# Patient Record
Sex: Female | Born: 1985 | Race: White | Hispanic: No | Marital: Single | Smoking: Never smoker
Health system: Southern US, Community
[De-identification: ages and names within clinical notes are randomized; demographics above are authoritative.]

## PROBLEM LIST (undated history)

## (undated) ENCOUNTER — Inpatient Hospital Stay (HOSPITAL_COMMUNITY): Payer: Self-pay

## (undated) DIAGNOSIS — Z8669 Personal history of other diseases of the nervous system and sense organs: Secondary | ICD-10-CM

## (undated) DIAGNOSIS — Z9109 Other allergy status, other than to drugs and biological substances: Secondary | ICD-10-CM

## (undated) DIAGNOSIS — J45909 Unspecified asthma, uncomplicated: Secondary | ICD-10-CM

## (undated) DIAGNOSIS — R87619 Unspecified abnormal cytological findings in specimens from cervix uteri: Secondary | ICD-10-CM

## (undated) DIAGNOSIS — I499 Cardiac arrhythmia, unspecified: Secondary | ICD-10-CM

## (undated) HISTORY — DX: Personal history of other diseases of the nervous system and sense organs: Z86.69

## (undated) HISTORY — DX: Unspecified abnormal cytological findings in specimens from cervix uteri: R87.619

---
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
## 2014-05-08 ENCOUNTER — Inpatient Hospital Stay (HOSPITAL_COMMUNITY)
Admission: AD | Admit: 2014-05-08 | Discharge: 2014-05-08 | Disposition: A | Discharge status: Home / Self Care | Payer: Medicaid Other | Source: Ambulatory Visit | Attending: Obstetrics & Gynecology | Admitting: Obstetrics & Gynecology

## 2014-05-08 ENCOUNTER — Encounter (HOSPITAL_COMMUNITY): Payer: Self-pay | Admitting: General Practice

## 2014-05-08 DIAGNOSIS — O9989 Other specified diseases and conditions complicating pregnancy, childbirth and the puerperium: Principal | ICD-10-CM

## 2014-05-08 DIAGNOSIS — O99891 Other specified diseases and conditions complicating pregnancy: Secondary | ICD-10-CM | POA: Insufficient documentation

## 2014-05-08 DIAGNOSIS — R1013 Epigastric pain: Secondary | ICD-10-CM | POA: Diagnosis present

## 2014-05-08 DIAGNOSIS — K529 Noninfective gastroenteritis and colitis, unspecified: Secondary | ICD-10-CM

## 2014-05-08 DIAGNOSIS — K2960 Other gastritis without bleeding: Secondary | ICD-10-CM

## 2014-05-08 DIAGNOSIS — R350 Frequency of micturition: Secondary | ICD-10-CM | POA: Insufficient documentation

## 2014-05-08 DIAGNOSIS — O210 Mild hyperemesis gravidarum: Secondary | ICD-10-CM | POA: Diagnosis not present

## 2014-05-08 DIAGNOSIS — O219 Vomiting of pregnancy, unspecified: Secondary | ICD-10-CM

## 2014-05-08 HISTORY — DX: Cardiac arrhythmia, unspecified: I49.9

## 2014-05-08 LAB — URINALYSIS, ROUTINE W REFLEX MICROSCOPIC
BILIRUBIN URINE: NEGATIVE
Glucose, UA: NEGATIVE mg/dL
HGB URINE DIPSTICK: NEGATIVE
KETONES UR: 40 mg/dL — AB
Leukocytes, UA: NEGATIVE
NITRITE: NEGATIVE
PROTEIN: NEGATIVE mg/dL
Specific Gravity, Urine: 1.02 (ref 1.005–1.030)
UROBILINOGEN UA: 0.2 mg/dL (ref 0.0–1.0)
pH: 6 (ref 5.0–8.0)

## 2014-05-08 LAB — CBC
HEMATOCRIT: 36.3 % (ref 36.0–46.0)
Hemoglobin: 12.6 g/dL (ref 12.0–15.0)
MCH: 30.9 pg (ref 26.0–34.0)
MCHC: 34.7 g/dL (ref 30.0–36.0)
MCV: 89 fL (ref 78.0–100.0)
PLATELETS: 192 10*3/uL (ref 150–400)
RBC: 4.08 MIL/uL (ref 3.87–5.11)
RDW: 13.1 % (ref 11.5–15.5)
WBC: 9.5 10*3/uL (ref 4.0–10.5)

## 2014-05-08 LAB — POCT PREGNANCY, URINE: Preg Test, Ur: POSITIVE — AB

## 2014-05-08 MED ORDER — PROMETHAZINE HCL 25 MG PO TABS: 25.0000 mg | ORAL_TABLET | Freq: Four times a day (QID) | ORAL | Status: DC | PRN

## 2014-05-08 MED ORDER — RANITIDINE HCL 150 MG PO TABS: 150.0000 mg | ORAL_TABLET | Freq: Two times a day (BID) | ORAL | Status: DC

## 2014-05-08 MED ORDER — RANITIDINE HCL 150 MG PO TABS
150.0000 mg | ORAL_TABLET | Freq: Two times a day (BID) | ORAL | Status: DC
Start: 2014-05-08 — End: 2014-06-13

## 2014-05-08 MED ORDER — FAMOTIDINE IN NACL 20-0.9 MG/50ML-% IV SOLN
20.0000 mg | Freq: Once | INTRAVENOUS | Status: AC
  Administered 2014-05-08: 20 mg via INTRAVENOUS
  Filled 2014-05-08: qty 50

## 2014-05-08 MED ORDER — PROMETHAZINE HCL 25 MG/ML IJ SOLN
25.0000 mg | Freq: Once | INTRAVENOUS | Status: AC
  Administered 2014-05-08: 25 mg via INTRAVENOUS
  Filled 2014-05-08: qty 1

## 2014-05-08 NOTE — MAU Provider Note (Signed)
History     CSN: [id number]  Arrival date and time: 05/08/14 1305   None     Chief Complaint  Patient presents with   Abdominal Pain   Diarrhea   Fever   Emesis   HPI 28 y.o. Catherine Moreno @[redacted]w[redacted]d  presents complaining of epigastric abdominal pain that began 5-6 days ago.  2 days ago she noticed a fever up to 101 degrees this morning.  She has taken Tylenol today.  The pain is in the top of her abdomen 9/10 pain scale at it worst (presently 5/10), not associated with eating.  She has not noticed aggravating factors.  Occasionally she has lower abdominal pain also but states it "feels very GI related."  She complains of extreme nausea but no vomiting.  She has reduced appetite due to the nausea.  She notes previous episodes of indigestion and states this is very different.  She has increased urinary frequency but not dysuria.  She has no LOF, bleeding.    OB History   Grav Para Term Preterm Abortions TAB SAB Ect Mult Living   1               Past Medical History  Diagnosis Date   Dysrhythmia     svt    Past Surgical History  Procedure Laterality Date   Wisdom tooth extraction Bilateral 2007    History reviewed. No pertinent family history.  History  Substance Use Topics   Smoking status: Never Smoker    Smokeless tobacco: Never Used   Alcohol Use: No    Allergies:  Allergies  Allergen Reactions   Latex     No prescriptions prior to admission    Review of Systems  Constitutional: Positive for fever, chills and malaise/fatigue. Negative for diaphoresis.  HENT: Negative for congestion and sore throat.   Eyes: Negative for blurred vision.  Respiratory: Negative for cough, shortness of breath and wheezing.   Cardiovascular: Negative for chest pain and palpitations.  Gastrointestinal: Positive for nausea, abdominal pain and diarrhea. Negative for heartburn, vomiting and constipation.  Genitourinary: Positive for frequency. Negative for dysuria, urgency and  hematuria.  Skin: Negative for itching and rash.  Neurological: Positive for dizziness and weakness. Negative for headaches.   Physical Exam   Blood pressure 114/64, pulse 77, temperature 98.9 F (37.2 C), resp. rate 20, height 5\' 8"  (1.727 m), weight 58.514 kg (129 lb), last menstrual period 03/24/2014.  Physical Exam  Constitutional: She is oriented to person, place, and time. She appears well-developed and well-nourished. No distress.  HENT:  Head: Normocephalic and atraumatic.  Eyes: EOM are normal.  Neck: Normal range of motion.  Cardiovascular: Normal rate, regular rhythm and normal heart sounds.  Exam reveals no gallop and no friction rub.   No murmur heard. Respiratory: Effort normal and breath sounds normal.  GI: Soft. Bowel sounds are normal. There is tenderness (Palpation of RUQ results in pain in LUQ.  Palpation of LUQ results in pain in LUQ.  There is no tenderness in either lower quadrant for the duration of MAU stay.).  Musculoskeletal: Normal range of motion.  Neurological: She is alert and oriented to person, place, and time.  Skin: Skin is warm. She is diaphoretic.  Psychiatric: She has a normal mood and affect.    MAU Course  Procedures IV - 1 liter LR given with 25mg  phenergan and 20mg  pepcid  MDM Neg u/a, CBC stable, no lower abd pain, no bleeding  Assessment and Plan  Assessment:  1. Nausea and vomiting during pregnancy prior to [redacted] weeks gestation   2. Gastroenteritis, infectious, presumed   3. Reflux gastritis      Plan: Discharge to home Phenergan for nausea Zantac 150mg  bid for heartburn Pt to see Ob to establish [facility] asap. Pt to return to MAU for emergencies: to include lower pelvic pain, vaginal bleeding.    Duane Boston Clark 05/08/2014, 2:45 PM

## 2014-05-08 NOTE — Discharge Instructions (Signed)
Morning Sickness Morning sickness is when you feel sick to your stomach (nauseous) during pregnancy. This nauseous feeling may or may not come with vomiting. It often occurs in the morning but can be a problem any time of day. Morning sickness is most common during the first trimester, but it may continue throughout pregnancy. While morning sickness is unpleasant, it is usually harmless unless you develop severe and continual vomiting (hyperemesis gravidarum). This condition requires more intense treatment.  CAUSES  The cause of morning sickness is not completely known but seems to be related to normal hormonal changes that occur in pregnancy. RISK FACTORS You are at greater risk if you:  Experienced nausea or vomiting before your pregnancy.  Had morning sickness during a previous pregnancy.  Are pregnant with more than one baby, such as twins. TREATMENT  Do not use any medicines (prescription, over-the-counter, or herbal) for morning sickness without first talking to your health care provider. Your health care provider may prescribe or recommend:  Vitamin B6 supplements.  Anti-nausea medicines.  The herbal medicine ginger. HOME CARE INSTRUCTIONS   Only take over-the-counter or prescription medicines as directed by your health care provider.  Taking multivitamins before getting pregnant can prevent or decrease the severity of morning sickness in most women.  Eat a piece of dry toast or unsalted crackers before getting out of bed in the morning.  Eat five or six small meals a day.  Eat dry and bland foods (rice, baked potato). Foods high in carbohydrates are often helpful.  Do not drink liquids with your meals. Drink liquids between meals.  Avoid greasy, fatty, and spicy foods.  Get someone to cook for you if the smell of any food causes nausea and vomiting.  If you feel nauseous after taking prenatal vitamins, take the vitamins at night or with a snack.  Snack on protein  foods (nuts, yogurt, cheese) between meals if you are hungry.  Eat unsweetened gelatins for desserts.  Wearing an acupressure wristband (worn for sea sickness) may be helpful.  Acupuncture may be helpful.  Do not smoke.  Get a humidifier to keep the air in your house free of odors.  Get plenty of fresh air. SEEK MEDICAL CARE IF:   Your home remedies are not working, and you need medicine.  You feel dizzy or lightheaded.  You are losing weight. SEEK IMMEDIATE MEDICAL CARE IF:   You have persistent and uncontrolled nausea and vomiting.  You pass out (faint). MAKE SURE YOU:  Understand these instructions.  Will watch your condition.  Will get help right away if you are not doing well or get worse. Document Released: 11/04/2006 Document Revised: 09/18/2013 Document Reviewed: 02/28/2013 [facility] Patient Information 2015 [place], [place]. This information is not intended to replace advice given to you by your health care provider. Make sure you discuss any questions you have with your health care provider. Nausea and Vomiting Nausea is a sick feeling that often comes before throwing up (vomiting). Vomiting is a reflex where stomach contents come out of your mouth. Vomiting can cause severe loss of body fluids (dehydration). Children and elderly adults can become dehydrated quickly, especially if they also have diarrhea. Nausea and vomiting are symptoms of a condition or disease. It is important to find the cause of your symptoms. CAUSES   Direct irritation of the stomach lining. This irritation can result from increased acid production (gastroesophageal reflux disease), infection, food poisoning, taking certain medicines (such as nonsteroidal anti-inflammatory drugs), alcohol use, or tobacco  use.  Signals from the brain.These signals could be caused by a headache, heat exposure, an inner ear disturbance, increased pressure in the brain from injury, infection, a tumor, or a  concussion, pain, emotional stimulus, or metabolic problems.  An obstruction in the gastrointestinal tract (bowel obstruction).  Illnesses such as diabetes, hepatitis, gallbladder problems, appendicitis, kidney problems, cancer, sepsis, atypical symptoms of a heart attack, or eating disorders.  Medical treatments such as chemotherapy and radiation.  Receiving medicine that makes you sleep (general anesthetic) during surgery. DIAGNOSIS Your caregiver may ask for tests to be done if the problems do not improve after a few days. Tests may also be done if symptoms are severe or if the reason for the nausea and vomiting is not clear. Tests may include:  Urine tests.  Blood tests.  Stool tests.  Cultures (to look for evidence of infection).  X-rays or other imaging studies. Test results can help your caregiver make decisions about treatment or the need for additional tests. TREATMENT You need to stay well hydrated. Drink frequently but in small amounts.You may wish to drink water, sports drinks, clear broth, or eat frozen ice pops or gelatin dessert to help stay hydrated.When you eat, eating slowly may help prevent nausea.There are also some antinausea medicines that may help prevent nausea. HOME CARE INSTRUCTIONS   Take all medicine as directed by your caregiver.  If you do not have an appetite, do not force yourself to eat. However, you must continue to drink fluids.  If you have an appetite, eat a normal diet unless your caregiver tells you differently.  Eat a variety of complex carbohydrates (rice, wheat, potatoes, bread), lean meats, yogurt, fruits, and vegetables.  Avoid high-fat foods because they are more difficult to digest.  Drink enough water and fluids to keep your urine clear or pale yellow.  If you are dehydrated, ask your caregiver for specific rehydration instructions. Signs of dehydration may include:  Severe thirst.  Dry lips and mouth.  Dizziness.  Dark  urine.  Decreasing urine frequency and amount.  Confusion.  Rapid breathing or pulse. SEEK IMMEDIATE MEDICAL CARE IF:   You have blood or brown flecks (like coffee grounds) in your vomit.  You have black or bloody stools.  You have a severe headache or stiff neck.  You are confused.  You have severe abdominal pain.  You have chest pain or trouble breathing.  You do not urinate at least once every 8 hours.  You develop cold or clammy skin.  You continue to vomit for longer than 24 to 48 hours.  You have a fever. MAKE SURE YOU:   Understand these instructions.  Will watch your condition.  Will get help right away if you are not doing well or get worse. Document Released: 09/13/2005 Document Revised: 12/06/2011 Document Reviewed: 02/10/2011 [facility] Patient Information 2015 [place], [place]. This information is not intended to replace advice given to you by your health care provider. Make sure you discuss any questions you have with your health care provider.

## 2014-05-08 NOTE — MAU Note (Signed)
Since Sat, has had a lot of abd pain, epigastric.  Diarrhea off and on, horrible nausea. Fever up to 101.7.

## 2014-05-28 ENCOUNTER — Encounter: Payer: Self-pay | Admitting: Obstetrics & Gynecology

## 2014-05-28 ENCOUNTER — Ambulatory Visit (INDEPENDENT_AMBULATORY_CARE_PROVIDER_SITE_OTHER): Payer: BC Managed Care – PPO | Admitting: Obstetrics & Gynecology

## 2014-05-28 VITALS — BP 125/71 | HR 93 | Wt 124.0 lb

## 2014-05-28 DIAGNOSIS — Z3491 Encounter for supervision of normal pregnancy, unspecified, first trimester: Secondary | ICD-10-CM

## 2014-05-28 DIAGNOSIS — Z3400 Encounter for supervision of normal first pregnancy, unspecified trimester: Secondary | ICD-10-CM | POA: Diagnosis not present

## 2014-05-28 DIAGNOSIS — Z3401 Encounter for supervision of normal first pregnancy, first trimester: Secondary | ICD-10-CM

## 2014-05-28 MED ORDER — DOXYLAMINE-PYRIDOXINE 10-10 MG PO TBEC: 2.0000 | DELAYED_RELEASE_TABLET | Freq: Every day | ORAL | Status: DC

## 2014-05-28 MED ORDER — ONDANSETRON HCL 8 MG PO TABS: 8.0000 mg | ORAL_TABLET | Freq: Three times a day (TID) | ORAL | Status: DC | PRN

## 2014-05-28 NOTE — Progress Notes (Signed)
Ketones-large. Last pap smear 5/15 WNL [place].  Bedside U/S showed IUP with FHT of 180BPM  CRL-23.30mm and GA [redacted]w[redacted]d

## 2014-05-28 NOTE — Progress Notes (Signed)
   Subjective:    Catherine Moreno is a G1P0 [redacted]w[redacted]d being seen today for her first obstetrical visit.  Her obstetrical history is significant for SVT. Patient does intend to breast feed. Pregnancy history fully reviewed.  Patient reports nausea and vomiting.  Filed Vitals:   05/28/14 1457  BP: 125/71  Pulse: 93  Weight: 124 lb (56.246 kg)    HISTORY: OB History  Gravida Para Term Preterm AB SAB TAB Ectopic Multiple Living  1             # Outcome Date GA Lbr Len/2nd Weight Sex Delivery Anes PTL Lv  1 CUR              Past Medical History  Diagnosis Date  . Dysrhythmia     svt  . Hx of migraines   . Abnormal Pap smear of cervix     repeat was neg   Past Surgical History  Procedure Laterality Date  . Wisdom tooth extraction Bilateral 2007   Family History  Problem Relation Age of Onset  . Hypothyroidism Mother   . Migraines Mother   . Lymphoma Maternal Grandmother   . Heart attack Maternal Grandmother   . COPD Maternal Grandmother   . Atrial fibrillation Maternal Grandfather   . COPD Maternal Grandfather   . Pulmonary Hypertension Maternal Grandfather   . Hypertension Father   . Lymphoma Paternal Grandfather     died age 43  . Hypertension Paternal Grandmother   . Hyperlipidemia Paternal Grandmother      Exam    Uterus:     Pelvic Exam:    Perineum: No Hemorrhoids   Vulva: normal   Vagina:  normal mucosa   pH: n/a   Cervix: no lesions   Adnexa: normal adnexa   Bony Pelvis: average  System: Breast:  normal appearance, no masses or tenderness   Skin: normal coloration and turgor, no rashes    Neurologic: oriented   Extremities: no deformities   HEENT sclera clear, anicteric   Mouth/Teeth mucous membranes moist, pharynx normal without lesions and dental hygiene good   Neck supple and no masses   Cardiovascular: regular rate and rhythm   Respiratory:  appears well, vitals normal, no respiratory distress, acyanotic, normal RR, chest clear, no wheezing,  crepitations, rhonchi, normal symmetric air entry   Abdomen: soft, non-tender; bowel sounds normal; no masses,  no organomegaly   Urinary: urethral meatus normal      Assessment:    Pregnancy: G1P0 There are no active problems to display for this patient.       Plan:     Initial labs drawn. Prenatal vitamins. Problem list reviewed and updated. Genetic Screening discussed:  Pt thinking about it and will call in 1 week to schedule  Ultrasound discussed; fetal survey: requested.  Follow up in 4 weeks.  Viable IUP by [place] 9 week 1 day Diclegis and phenergan for nausea and vomiting.  Pt to call if continues.  Aubery Douthat H. 05/28/2014

## 2014-06-01 LAB — OBSTETRIC PANEL
ANTIBODY SCREEN: NEGATIVE
BASOS ABS: 0 10*3/uL (ref 0.0–0.1)
BASOS PCT: 0 % (ref 0–1)
EOS ABS: 0.2 10*3/uL (ref 0.0–0.7)
Eosinophils Relative: 2 % (ref 0–5)
HCT: 34.3 % — ABNORMAL LOW (ref 36.0–46.0)
HEMOGLOBIN: 11.5 g/dL — AB (ref 12.0–15.0)
HEP B S AG: NEGATIVE
Lymphocytes Relative: 29 % (ref 12–46)
Lymphs Abs: 2.3 10*3/uL (ref 0.7–4.0)
MCH: 30 pg (ref 26.0–34.0)
MCHC: 33.5 g/dL (ref 30.0–36.0)
MCV: 89.6 fL (ref 78.0–100.0)
MONO ABS: 0.4 10*3/uL (ref 0.1–1.0)
MONOS PCT: 5 % (ref 3–12)
NEUTROS ABS: 5.1 10*3/uL (ref 1.7–7.7)
NEUTROS PCT: 64 % (ref 43–77)
Platelets: 193 10*3/uL (ref 150–400)
RBC: 3.83 MIL/uL — ABNORMAL LOW (ref 3.87–5.11)
RDW: 13.8 % (ref 11.5–15.5)
Rh Type: POSITIVE
Rubella: 1.04 Index — ABNORMAL HIGH (ref <0.90)
WBC: 7.9 10*3/uL (ref 4.0–10.5)

## 2014-06-01 LAB — HIV ANTIBODY (ROUTINE TESTING W REFLEX): HIV 1&2 Ab, 4th Generation: NONREACTIVE

## 2014-06-05 LAB — PRESCRIPTION MONITORING PROFILE (19 PANEL)
Amphetamine/Meth: NEGATIVE ng/mL
Barbiturate Screen, Urine: NEGATIVE ng/mL
Benzodiazepine Screen, Urine: NEGATIVE ng/mL
Buprenorphine, Urine: NEGATIVE ng/mL
CREATININE, URINE: 97.86 mg/dL (ref >20.0)
Cannabinoid Scrn, Ur: NEGATIVE ng/mL
Carisoprodol, Urine: NEGATIVE ng/mL
Cocaine Metabolites: NEGATIVE ng/mL
Fentanyl, Ur: NEGATIVE ng/mL
MDMA URINE: NEGATIVE ng/mL
MEPERIDINE UR: NEGATIVE ng/mL
Methadone Screen, Urine: NEGATIVE ng/mL
Methaqualone: NEGATIVE ng/mL
NITRITES URINE, INITIAL: NEGATIVE ug/mL
Opiate Screen, Urine: NEGATIVE ng/mL
Oxycodone Screen, Ur: NEGATIVE ng/mL
PH URINE, INITIAL: 5.7 pH (ref 4.5–8.9)
PROPOXYPHENE: NEGATIVE ng/mL
Phencyclidine, Ur: NEGATIVE ng/mL
TRAMADOL UR: NEGATIVE ng/mL
Tapentadol, urine: NEGATIVE ng/mL
Zolpidem, Urine: NEGATIVE ng/mL

## 2014-06-12 ENCOUNTER — Telehealth: Payer: Self-pay

## 2014-06-12 NOTE — Telephone Encounter (Signed)
Pt called concerned about nausea and dizziness for a couple of days. Spoke with patient and her 1st BP was 103/79. Pt rested and went to school at [facility] [facility] [employer] [facility] and taken BP there and it was 111/72 sitting 120/83 standing. Pt drink 30 ounces of water today and going to bathroom every hour. Suggest pt go to MAU for eval. Pt going to go home to rest and if don't get any better will go to MAU and patient is schedule here tomorrow with Dr. Marice Potter at 3:45p

## 2014-06-13 ENCOUNTER — Encounter: Payer: Self-pay | Admitting: Obstetrics & Gynecology

## 2014-06-13 ENCOUNTER — Ambulatory Visit (INDEPENDENT_AMBULATORY_CARE_PROVIDER_SITE_OTHER): Payer: BC Managed Care – PPO | Admitting: Obstetrics & Gynecology

## 2014-06-13 VITALS — BP 120/72 | HR 89 | Wt 127.0 lb

## 2014-06-13 DIAGNOSIS — Z3480 Encounter for supervision of other normal pregnancy, unspecified trimester: Secondary | ICD-10-CM | POA: Diagnosis not present

## 2014-06-13 DIAGNOSIS — Z3482 Encounter for supervision of other normal pregnancy, second trimester: Secondary | ICD-10-CM

## 2014-06-13 LAB — GLUCOSE, POCT (MANUAL RESULT ENTRY): POC GLUCOSE: 126 mg/dL — AB (ref 70–99)

## 2014-06-13 NOTE — Progress Notes (Signed)
Routine visit. Reassurance given regarding dizziness. She is feeling fatigued so I will check a TSH and ferritin. She will get an early glucola today as her RBS is 129 and she has not eaten for hours. She will get the flu vaccine at another visit as she does not want a shot today. She declines a First screen.

## 2014-06-13 NOTE — Progress Notes (Signed)
Pt had dizzy episode last week with BP 88/40 at hospital. Pt stated she was better for a few days then started to notice she would be dizzy when going from sitting to standing position. BP normal. Not orthostatic per pt.

## 2014-06-14 ENCOUNTER — Encounter: Payer: Self-pay | Admitting: Obstetrics & Gynecology

## 2014-06-14 LAB — FERRITIN: Ferritin: 67 ng/mL (ref 10–291)

## 2014-06-14 LAB — TSH: TSH: 1.705 u[IU]/mL (ref 0.350–4.500)

## 2014-06-14 LAB — GLUCOSE TOLERANCE, 1 HOUR (50G) W/O FASTING: GLUCOSE 1 HOUR GTT: 84 mg/dL (ref 70–140)

## 2014-06-25 ENCOUNTER — Ambulatory Visit (INDEPENDENT_AMBULATORY_CARE_PROVIDER_SITE_OTHER): Payer: BC Managed Care – PPO | Admitting: Obstetrics & Gynecology

## 2014-06-25 ENCOUNTER — Encounter: Payer: Self-pay | Admitting: Obstetrics & Gynecology

## 2014-06-25 VITALS — BP 110/62 | HR 82 | Wt 128.0 lb

## 2014-06-25 DIAGNOSIS — Z3400 Encounter for supervision of normal first pregnancy, unspecified trimester: Secondary | ICD-10-CM

## 2014-06-25 DIAGNOSIS — Z3403 Encounter for supervision of normal first pregnancy, third trimester: Secondary | ICD-10-CM

## 2014-06-25 NOTE — Progress Notes (Signed)
Routine visit. Labs from last week all normal. She reports that the dizziness is improving. She is very stressed with organic chemistry classes.

## 2014-07-26 ENCOUNTER — Ambulatory Visit (INDEPENDENT_AMBULATORY_CARE_PROVIDER_SITE_OTHER): Payer: BC Managed Care – PPO | Admitting: Advanced Practice Midwife

## 2014-07-26 ENCOUNTER — Encounter: Payer: Self-pay | Admitting: Advanced Practice Midwife

## 2014-07-26 VITALS — BP 112/67 | HR 87 | Temp 97.6°F | Wt 132.0 lb

## 2014-07-26 DIAGNOSIS — F329 Major depressive disorder, single episode, unspecified: Secondary | ICD-10-CM

## 2014-07-26 DIAGNOSIS — F32A Depression, unspecified: Secondary | ICD-10-CM

## 2014-07-26 DIAGNOSIS — Z3492 Encounter for supervision of normal pregnancy, unspecified, second trimester: Secondary | ICD-10-CM

## 2014-07-26 NOTE — Progress Notes (Signed)
Feeling stressed with pre-med classes. 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 States is feeling depressed, hard to get going in the morning. Has history of depression. No meds now. Discussed therapy and possible medication if they recommend it.  Wants to explore counselors via [employer] [facility]. Denies SI/HI. Plan anatomy US in 2 wks.

## 2014-07-26 NOTE — Progress Notes (Signed)
C/O pain after urinating

## 2014-07-26 NOTE — Patient Instructions (Signed)
Second Trimester of Pregnancy The second trimester is from week 13 through week 28, months 4 through 6. The second trimester is often a time when you feel your best. Your body has also adjusted to being pregnant, and you begin to feel better physically. Usually, morning sickness has lessened or quit completely, you may have more energy, and you may have an increase in appetite. The second trimester is also a time when the fetus is growing rapidly. At the end of the sixth month, the fetus is about 9 inches long and weighs about 1 pounds. You will likely begin to feel the baby move (quickening) between 18 and 20 weeks of the pregnancy. BODY CHANGES Your body goes through many changes during pregnancy. The changes vary from woman to woman.   Your weight will continue to increase. You will notice your lower abdomen bulging out.  You may begin to get stretch marks on your hips, abdomen, and breasts.  You may develop headaches that can be relieved by medicines approved by your health care provider.  You may urinate more often because the fetus is pressing on your bladder.  You may develop or continue to have heartburn as a result of your pregnancy.  You may develop constipation because certain hormones are causing the muscles that push waste through your intestines to slow down.  You may develop hemorrhoids or swollen, bulging veins (varicose veins).  You may have back pain because of the weight gain and pregnancy hormones relaxing your joints between the bones in your pelvis and as a result of a shift in weight and the muscles that support your balance.  Your breasts will continue to grow and be tender.  Your gums may bleed and may be sensitive to brushing and flossing.  Dark spots or blotches (chloasma, mask of pregnancy) may develop on your face. This will likely fade after the baby is born.  A dark line from your belly button to the pubic area (linea nigra) may appear. This will likely fade  after the baby is born.  You may have changes in your hair. These can include thickening of your hair, rapid growth, and changes in texture. Some women also have hair loss during or after pregnancy, or hair that feels dry or thin. Your hair will most likely return to normal after your baby is born. WHAT TO EXPECT AT YOUR PRENATAL VISITS During a routine prenatal visit:  You will be weighed to make sure you and the fetus are growing normally.  Your blood pressure will be taken.  Your abdomen will be measured to track your baby's growth.  The fetal heartbeat will be listened to.  Any test results from the previous visit will be discussed. Your health care provider may ask you:  How you are feeling.  If you are feeling the baby move.  If you have had any abnormal symptoms, such as leaking fluid, bleeding, severe headaches, or abdominal cramping.  If you have any questions. Other tests that may be performed during your second trimester include:  Blood tests that check for:  Low iron levels (anemia).  Gestational diabetes (between 24 and 28 weeks).  Rh antibodies.  Urine tests to check for infections, diabetes, or protein in the urine.  An ultrasound to confirm the proper growth and development of the baby.  An amniocentesis to check for possible genetic problems.  Fetal screens for spina bifida and Down syndrome. HOME CARE INSTRUCTIONS   Avoid all smoking, herbs, alcohol, and unprescribed   drugs. These chemicals affect the formation and growth of the baby.  Follow your health care provider's instructions regarding medicine use. There are medicines that are either safe or unsafe to take during pregnancy.  Exercise only as directed by your health care provider. Experiencing uterine cramps is a good sign to stop exercising.  Continue to eat regular, healthy meals.  Wear a good support bra for breast tenderness.  Do not use hot tubs, steam rooms, or saunas.  Wear your  seat belt at all times when driving.  Avoid raw meat, uncooked cheese, cat litter boxes, and soil used by cats. These carry germs that can cause birth defects in the baby.  Take your prenatal vitamins.  Try taking a stool softener (if your health care provider approves) if you develop constipation. Eat more high-fiber foods, such as fresh vegetables or fruit and whole grains. Drink plenty of fluids to keep your urine clear or pale yellow.  Take warm sitz baths to soothe any pain or discomfort caused by hemorrhoids. Use hemorrhoid cream if your health care provider approves.  If you develop varicose veins, wear support hose. Elevate your feet for 15 minutes, 3-4 times a day. Limit salt in your diet.  Avoid heavy lifting, wear low heel shoes, and practice good posture.  Rest with your legs elevated if you have leg cramps or low back pain.  Visit your dentist if you have not gone yet during your pregnancy. Use a soft toothbrush to brush your teeth and be gentle when you floss.  A sexual relationship may be continued unless your health care provider directs you otherwise.  Continue to go to all your prenatal visits as directed by your health care provider. SEEK MEDICAL CARE IF:   You have dizziness.  You have mild pelvic cramps, pelvic pressure, or nagging pain in the abdominal area.  You have persistent nausea, vomiting, or diarrhea.  You have a bad smelling vaginal discharge.  You have pain with urination. SEEK IMMEDIATE MEDICAL CARE IF:   You have a fever.  You are leaking fluid from your vagina.  You have spotting or bleeding from your vagina.  You have severe abdominal cramping or pain.  You have rapid weight gain or loss.  You have shortness of breath with chest pain.  You notice sudden or extreme swelling of your face, hands, ankles, feet, or legs.  You have not felt your baby move in over an hour.  You have severe headaches that do not go away with  medicine.  You have vision changes. Document Released: 09/07/2001 Document Revised: 09/18/2013 Document Reviewed: 11/14/2012 Exit[employer] Patient Information 2015 [employer]. This information is not intended to replace advice given to you by your health care provider. Make sure you discuss any questions you have with your health care provider.  

## 2014-07-29 ENCOUNTER — Encounter: Payer: Self-pay | Admitting: Advanced Practice Midwife

## 2014-08-06 ENCOUNTER — Other Ambulatory Visit: Payer: Self-pay | Admitting: Obstetrics & Gynecology

## 2014-08-06 ENCOUNTER — Ambulatory Visit (HOSPITAL_COMMUNITY)
Admission: RE | Admit: 2014-08-06 | Discharge: 2014-08-06 | Disposition: A | Discharge status: Home / Self Care | Payer: BC Managed Care – PPO | Source: Ambulatory Visit | Attending: Advanced Practice Midwife | Admitting: Advanced Practice Midwife

## 2014-08-06 DIAGNOSIS — Z3689 Encounter for other specified antenatal screening: Secondary | ICD-10-CM | POA: Insufficient documentation

## 2014-08-06 DIAGNOSIS — Z3A19 19 weeks gestation of pregnancy: Secondary | ICD-10-CM | POA: Diagnosis not present

## 2014-08-06 DIAGNOSIS — Z3492 Encounter for supervision of normal pregnancy, unspecified, second trimester: Secondary | ICD-10-CM

## 2014-08-06 DIAGNOSIS — Z36 Encounter for antenatal screening of mother: Secondary | ICD-10-CM | POA: Insufficient documentation

## 2014-08-26 ENCOUNTER — Ambulatory Visit (INDEPENDENT_AMBULATORY_CARE_PROVIDER_SITE_OTHER): Payer: BC Managed Care – PPO | Admitting: Obstetrics & Gynecology

## 2014-08-26 VITALS — BP 105/67 | HR 71 | Wt 136.0 lb

## 2014-08-26 DIAGNOSIS — Z3403 Encounter for supervision of normal first pregnancy, third trimester: Secondary | ICD-10-CM

## 2014-08-26 NOTE — Progress Notes (Signed)
Pt still feeling depressed but no SI/HI.  Pt declines meds.  Nausea is  Better.  Gained 4 pounds.  Moving this week.  Has D in organic chemistry.  Passing physics.  Declines all genetic testing.  Has had some sadness/surprise when she found out she was having a boy.  Is adjusting.  Has talked about it with husband.

## 2014-09-23 ENCOUNTER — Ambulatory Visit (INDEPENDENT_AMBULATORY_CARE_PROVIDER_SITE_OTHER): Payer: BC Managed Care – PPO | Admitting: Advanced Practice Midwife

## 2014-09-23 VITALS — BP 112/71 | HR 86 | Wt 144.0 lb

## 2014-09-23 DIAGNOSIS — Z3403 Encounter for supervision of normal first pregnancy, third trimester: Secondary | ICD-10-CM

## 2014-09-23 NOTE — Progress Notes (Signed)
Doing well.  Good fetal movement, denies vaginal bleeding, LOF, regular contractions.  Reports feeling much better since nausea improved and can eat and drink regularly.  Reports some pain near umbilicus, area soft, nontender to palpation, no signs of hernia. Glucose tolerance in 2 weeks at next visit.

## 2014-09-27 NOTE — L&D Delivery Note (Cosign Needed)
Delivery Note At 8:06 PM a viable female was delivered via Vaginal, Spontaneous Delivery (Presentation: Left Occiput Anterior).  APGAR: 6, 8; weight pending .   Placenta status: Intact, Spontaneous.  Cord: 3 vessels with the following complications: None.  Cord pH: n/a  Anesthesia: Epidural  Episiotomy: None Lacerations: 1st degree perineal Suture Repair: 3.0 vicryl rapide Est. Blood Loss (mL): 350  Mom to postpartum.  Baby to Couplet care / Skin to Skin.  William DaltonMcEachern, Linas Stepter 01/06/2015, 8:36 PM

## 2014-09-30 ENCOUNTER — Other Ambulatory Visit: Payer: Self-pay | Admitting: *Deleted

## 2014-09-30 DIAGNOSIS — O219 Vomiting of pregnancy, unspecified: Secondary | ICD-10-CM

## 2014-09-30 MED ORDER — DOXYLAMINE-PYRIDOXINE 10-10 MG PO TBEC: 2.0000 | DELAYED_RELEASE_TABLET | Freq: Every day | ORAL | Status: DC

## 2014-09-30 NOTE — Telephone Encounter (Signed)
RF authorization for Diclegis sent to pharmacy.

## 2014-10-07 ENCOUNTER — Ambulatory Visit (INDEPENDENT_AMBULATORY_CARE_PROVIDER_SITE_OTHER): Payer: BLUE CROSS/BLUE SHIELD | Admitting: Obstetrics & Gynecology

## 2014-10-07 VITALS — BP 109/68 | HR 85 | Wt 147.0 lb

## 2014-10-07 DIAGNOSIS — Z36 Encounter for antenatal screening of mother: Secondary | ICD-10-CM

## 2014-10-07 DIAGNOSIS — Z3403 Encounter for supervision of normal first pregnancy, third trimester: Secondary | ICD-10-CM

## 2014-10-07 NOTE — Addendum Note (Signed)
Addended by: Granville LewisLARK, Maleia Weems L on: 10/07/2014 03:18 PM   Modules accepted: Orders

## 2014-10-07 NOTE — Progress Notes (Signed)
-   Declines Tdap

## 2014-10-07 NOTE — Progress Notes (Signed)
Pt will think about tdap.  Glucola today.  Pt having some problems with depression.  Is not excited about being pregnant and feel her life and career plans are on hold with the pregnancy.  Pt has no HI or SI.  Would like to go to counseling in lieu of medicine.  Will refer to behavior health.  Pt to contact us if depression gets worse.  GCT and 28 week labs today.

## 2014-10-08 ENCOUNTER — Telehealth: Payer: Self-pay | Admitting: *Deleted

## 2014-10-08 LAB — GLUCOSE TOLERANCE, 1 HOUR (50G) W/O FASTING: Glucose, 1 Hour GTT: 103 mg/dL (ref 70–140)

## 2014-10-08 LAB — CBC
HEMATOCRIT: 35.5 % — AB (ref 36.0–46.0)
HEMOGLOBIN: 11.9 g/dL — AB (ref 12.0–15.0)
MCH: 30.7 pg (ref 26.0–34.0)
MCHC: 33.5 g/dL (ref 30.0–36.0)
MCV: 91.7 fL (ref 78.0–100.0)
MPV: 11.4 fL (ref 8.6–12.4)
Platelets: 227 10*3/uL (ref 150–400)
RBC: 3.87 MIL/uL (ref 3.87–5.11)
RDW: 13.2 % (ref 11.5–15.5)
WBC: 15 10*3/uL — ABNORMAL HIGH (ref 4.0–10.5)

## 2014-10-08 LAB — RPR

## 2014-10-08 LAB — HIV ANTIBODY (ROUTINE TESTING W REFLEX): HIV: NONREACTIVE

## 2014-10-08 NOTE — Telephone Encounter (Signed)
-----   Message from Lesly DukesKelly H Leggett, MD sent at 10/08/2014  2:37 PM EST ----- Labs nml except elevated WBC count  RN to call and see if she feels well.

## 2014-10-08 NOTE — Telephone Encounter (Signed)
Pt notified of normal 1 hr GTT.  She states that she is not suffering from a cold or sinus at this time although her WBC was  Elevated.

## 2014-10-21 ENCOUNTER — Ambulatory Visit (INDEPENDENT_AMBULATORY_CARE_PROVIDER_SITE_OTHER): Payer: BLUE CROSS/BLUE SHIELD | Admitting: Obstetrics & Gynecology

## 2014-10-21 ENCOUNTER — Encounter: Payer: Self-pay | Admitting: Obstetrics & Gynecology

## 2014-10-21 VITALS — BP 122/74 | HR 89 | Wt 150.0 lb

## 2014-10-21 DIAGNOSIS — Z23 Encounter for immunization: Secondary | ICD-10-CM

## 2014-10-21 DIAGNOSIS — Z3403 Encounter for supervision of normal first pregnancy, third trimester: Secondary | ICD-10-CM

## 2014-10-21 NOTE — Progress Notes (Signed)
Routine visit. TDAP today. She is having some contractions. PTL precautions reviewed. Good FM.

## 2014-10-23 ENCOUNTER — Ambulatory Visit (INDEPENDENT_AMBULATORY_CARE_PROVIDER_SITE_OTHER): Payer: BLUE CROSS/BLUE SHIELD | Admitting: Physician Assistant

## 2014-10-23 VITALS — BP 109/72 | HR 83 | Ht 69.0 in | Wt 151.0 lb

## 2014-10-23 DIAGNOSIS — F418 Other specified anxiety disorders: Secondary | ICD-10-CM

## 2014-10-23 NOTE — Progress Notes (Signed)
Psychiatric Assessment Adult  Patient Identification:  Catherine Moreno Date of Evaluation:  10/23/2014 Chief Complaint: Depression and stress History of Chief Complaint:   Chief Complaint  Patient presents with  . Depression    HPI Comments: 29 year old SWF who is [redacted] weeks pregnant.  She is referred by OB Dr. Penne Lash.  The patient has a history of depression for which she was treated as an outpatient in 2012.  She is currently living with her fiance' who is her father's baby. This was an unplanned pregnancy, and the patient's first baby.  She is currently employed as a Radiation protection practitioner and is also enrolled in school at [employer] in pre-med. 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    She is taking 6 hours of classes this semester in order to keep her health insurance that she gets from school.  Her fiance' is working in a Associate Professor.  Neither of them has any financial support from their family.  Adalida is working only occasionally and finances are her biggest concern.    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  She denies major depressive symptoms, but does endorse increased anxiety, poor sleep, poor memory, loss of interest, irritability,and excessive worry, with low energy.  She denies SI/HI or AVH.  The baby is healthy and her EDC is 12-29-2014.  Review of Systems Physical Exam  Depressive Symptoms: depressed mood, anhedonia, insomnia, fatigue, difficulty concentrating, impaired memory, anxiety,  (Hypo) Manic Symptoms:   Elevated Mood:  No Irritable Mood:  Yes Grandiosity:  No Distractibility:  No Labiality of Mood:  No Delusions:  No Hallucinations:  No Impulsivity:  No Sexually Inappropriate Behavior:  No Financial Extravagance:  No Flight of Ideas:  No  Anxiety Symptoms: Excessive Worry:  Yes Panic Symptoms:   No Agoraphobia:  No Obsessive Compulsive: No  Symptoms:  Specific Phobias:  No Social Anxiety:  No  Psychotic Symptoms:  Hallucinations: No  Delusions:  No Paranoia:  No   Ideas of Reference:  No  PTSD Symptoms: Ever had a traumatic exposure:  No Had a traumatic exposure in the last month:   Re-experiencing:   Hypervigilance:   Hyperarousal:   Avoidance:    Traumatic Brain Injury: No   Past Psychiatric History: Diagnosis: Situational depression  Hospitalizations: none  Outpatient Care: OB-Gyn  Substance Abuse Care: na  Self-Mutilation: na  Suicidal Attempts: none  Violent Behaviors: none   Past Medical History:   Past Medical History  Diagnosis Date  . Dysrhythmia     svt  . Hx of migraines   . Abnormal Pap smear of cervix     repeat was neg   History of Loss of Consciousness:  No Seizure History:  No Cardiac History:  No Allergies:   Allergies  Allergen Reactions  . Codeine     Other reaction(s): Hallucinations  . Latex    Current Medications:  Current Outpatient Prescriptions  Medication Sig Dispense Refill  . Docosahexaenoic Acid (DHA PO) Take by mouth.    . docusate sodium (COLACE) 100 MG capsule Take 100 mg by mouth 2 (two) times daily.    . Doxylamine-Pyridoxine (DICLEGIS) 10-10 MG TBEC Take 2 tablets by mouth at bedtime. 60 tablet 1  . ondansetron (ZOFRAN) 8 MG tablet Take 1 tablet (8 mg total) by mouth every 8 (eight) hours as needed for  nausea or vomiting. 30 tablet 2  . Prenatal Multivit-Min-Fe-FA (PRENATAL VITAMINS PO) Take by mouth.    . promethazine (PHENERGAN) 25 MG tablet Take 1 tablet (25 mg total) by mouth every 6 (six) hours as needed for nausea or vomiting. 30 tablet 0  . simethicone (MYLICON) 125 MG chewable tablet Chew 125 mg by mouth every 6 (six) hours as needed for flatulence.     No current facility-administered medications for this visit.    Previous Psychotropic Medications:  Medication Dose   celexa                        Substance Abuse History in the last 12 months: Denies Medical Consequences of Substance Abuse: NA  Legal Consequences of Substance Abuse: NA  Family Consequences of Substance Abuse: NA  Blackouts:  No DT's:  No Withdrawal Symptoms:  No   Social History: Current [place]idence[place] Place of Birth[place] Family Members: sibs step and half Marital Status:  Single Children:   Sons:   Daughters:  Relationships:  Education:  Print production plannerGraduate Educational Problems/Performance:  Religious Beliefs/Practices: Christian History of Abuse: emotional (father stated that he loved her brother more because he chose to adopt him) Armed forces technical officerccupational Experiences; Military History:  None. Legal History: none Hobbies/Interests: none  Family History:   Family History  Problem Relation Age of Onset  . Hypothyroidism Mother   . Migraines Mother   . Lymphoma Maternal Grandmother   . Heart attack Maternal Grandmother   . COPD Maternal Grandmother   . Atrial fibrillation Maternal Grandfather   . COPD Maternal Grandfather   . Pulmonary Hypertension Maternal Grandfather   . Hypertension Father   . Lymphoma Paternal Grandfather     died age 27  . Hypertension Paternal Grandmother   . Hyperlipidemia Paternal Grandmother     Mental Status Examination/Evaluation: Objective:  Appearance: Fairly Groomed  Patent attorneyye Contact::  Good  Speech:  Clear and Coherent  Volume:  Normal  Mood:  euthymic  Affect:  Congruent  Thought Process:  Coherent and Goal Directed  Orientation:  Full (Time, Place, and Person)  Thought Content:  WDL  Suicidal Thoughts:  denies  Homicidal Thoughts:  No  Judgement:  Good  Insight:  Present  Psychomotor Activity:  Normal  Akathisia:  No  Handed:  Right  AIMS (if indicated):    Assets:  Communication Skills Desire for Improvement Housing Leisure Time Resilience Social Support Talents/Skills Transportation Vocational/Educational    Laboratory/X-Ray  Psychological Evaluation(s)   celexa     Assessment:    AXIS I  Situational anxiety,   AXIS II Deferred  AXIS III Past Medical History  Diagnosis Date  . Dysrhythmia     svt  . Hx of migraines   . Abnormal Pap smear of cervix     repeat was neg     AXIS IV economic problems, educational problems, occupational problems and other psychosocial or environmental problems  AXIS V 61-70 mild symptoms   Treatment Plan/Recommendations:  Plan of Care: Patient is not interested in taking medication  Laboratory:    Psychotherapy: recomended  Medications: none at this time  Routine PRN Medications:  No  Consultations: if needed  Safety Concerns:  Not at this time  Other:      Ranee Peasley, PA-C 1/27/20161:19 PM

## 2014-10-29 ENCOUNTER — Inpatient Hospital Stay (HOSPITAL_COMMUNITY)
Admission: AD | Admit: 2014-10-29 | Discharge: 2014-10-29 | Disposition: A | Discharge status: Home / Self Care | Payer: BLUE CROSS/BLUE SHIELD | Source: Ambulatory Visit | Attending: Family Medicine | Admitting: Family Medicine

## 2014-10-29 ENCOUNTER — Encounter (HOSPITAL_COMMUNITY): Payer: Self-pay | Admitting: Certified Registered"

## 2014-10-29 ENCOUNTER — Encounter (HOSPITAL_COMMUNITY): Payer: Self-pay | Admitting: *Deleted

## 2014-10-29 DIAGNOSIS — O9989 Other specified diseases and conditions complicating pregnancy, childbirth and the puerperium: Secondary | ICD-10-CM | POA: Insufficient documentation

## 2014-10-29 DIAGNOSIS — A084 Viral intestinal infection, unspecified: Secondary | ICD-10-CM | POA: Insufficient documentation

## 2014-10-29 DIAGNOSIS — Z3A31 31 weeks gestation of pregnancy: Secondary | ICD-10-CM | POA: Insufficient documentation

## 2014-10-29 DIAGNOSIS — E860 Dehydration: Secondary | ICD-10-CM | POA: Diagnosis not present

## 2014-10-29 DIAGNOSIS — O212 Late vomiting of pregnancy: Secondary | ICD-10-CM | POA: Diagnosis present

## 2014-10-29 DIAGNOSIS — K529 Noninfective gastroenteritis and colitis, unspecified: Secondary | ICD-10-CM

## 2014-10-29 HISTORY — DX: Other allergy status, other than to drugs and biological substances: Z91.09

## 2014-10-29 HISTORY — DX: Unspecified asthma, uncomplicated: J45.909

## 2014-10-29 LAB — URINALYSIS, ROUTINE W REFLEX MICROSCOPIC
BILIRUBIN URINE: NEGATIVE
Glucose, UA: NEGATIVE mg/dL
HGB URINE DIPSTICK: NEGATIVE
Ketones, ur: 40 mg/dL — AB
Leukocytes, UA: NEGATIVE
NITRITE: NEGATIVE
PH: 6 (ref 5.0–8.0)
Protein, ur: NEGATIVE mg/dL
Specific Gravity, Urine: 1.025 (ref 1.005–1.030)
UROBILINOGEN UA: 0.2 mg/dL (ref 0.0–1.0)

## 2014-10-29 MED ORDER — ONDANSETRON 8 MG PO TBDP
8.0000 mg | ORAL_TABLET | Freq: Once | ORAL | Status: AC
  Administered 2014-10-29: 8 mg via ORAL
  Filled 2014-10-29: qty 1

## 2014-10-29 MED ORDER — LACTATED RINGERS IV BOLUS (SEPSIS)
1000.0000 mL | Freq: Once | INTRAVENOUS | Status: AC
  Administered 2014-10-29: 1000 mL via INTRAVENOUS

## 2014-10-29 MED ORDER — ONDANSETRON HCL 8 MG PO TABS: 8.0000 mg | ORAL_TABLET | Freq: Three times a day (TID) | ORAL | Status: DC | PRN

## 2014-10-29 NOTE — MAU Note (Signed)
vomiting and diarrhea started at 1530 today.  Has had 3 liquid stools since onset. No one at home has it.

## 2014-10-29 NOTE — Discharge Instructions (Signed)
Gastritis, Adult °Gastritis is soreness and puffiness (inflammation) of the lining of the stomach. If you do not get help, gastritis can cause bleeding and sores (ulcers) in the stomach. °HOME CARE  °· Only take medicine as told by your doctor. °· If you were given antibiotic medicines, take them as told. Finish the medicines even if you start to feel better. °· Drink enough fluids to keep your pee (urine) clear or pale yellow. °· Avoid foods and drinks that make your problems worse. Foods you may want to avoid include: °¨ Caffeine or alcohol. °¨ Chocolate. °¨ Mint. °¨ Garlic and onions. °¨ Spicy foods. °¨ Citrus fruits, including oranges, lemons, or limes. °¨ Food containing tomatoes, including sauce, chili, salsa, and pizza. °¨ Fried and fatty foods. °· Eat small meals throughout the day instead of large meals. °GET HELP RIGHT AWAY IF:  °· You have black or dark red poop (stools). °· You throw up (vomit) blood. It may look like coffee grounds. °· You cannot keep fluids down. °· Your belly (abdominal) pain gets worse. °· You have a fever. °· You do not feel better after 1 week. °· You have any other questions or concerns. °MAKE SURE YOU:  °· Understand these instructions. °· Will watch your condition. °· Will get help right away if you are not doing well or get worse. °Document Released: 03/01/2008 Document Revised: 12/06/2011 Document Reviewed: 10/27/2011 °ExitCare® Patient Information ©2015 [employer]. This information is not intended to replace advice given to you by your health care provider. Make sure you discuss any questions you have with your health care provider. ° °

## 2014-10-29 NOTE — MAU Provider Note (Signed)
Chief Complaint:  Diarrhea and Emesis   None     HPI: Catherine Moreno is a 29 y.o. G1P0 at 3930w2d who presents to maternity admissions reporting nausea, vomiting, diarrhea since 3pm. Patient states she went out for lunch today w/ FOB. When she got home she didn't feel well so she layed down. Around 3pm she felt bloated and had an episode of diarrhea. This was swiftly followed by an episode of emesis. Patient had a total of 3 episodes of diarrhea and 3 episodes of emesis. Patient  Denies contractions, leakage of fluid or vaginal bleeding. Good fetal movement.   Pregnancy Course:  [facility]  Dating By LMP and c/w 9 week US  Genetic Screen Declines all genetic screening  Anatomic US  Normal anatomy  GTT Early:   2184           Third trimester: 103  TDaP vaccine Thinking about it  Flu vaccine Declines  GBS   Contraception Mirena  Baby Food breast  Circumcision no  Pediatrician   Support Person [name]     Past Medical History: Past Medical History  Diagnosis Date  . Dysrhythmia     svt  . Hx of migraines   . Abnormal Pap smear of cervix     repeat was neg  . Asthma   . Environmental allergies     Past obstetric history: OB History  Gravida Para Term Preterm AB SAB TAB Ectopic Multiple Living  1             # Outcome Date GA Lbr Len/2nd Weight Sex Delivery Anes PTL Lv  1 Current               Past Surgical History: Past Surgical History  Procedure Laterality Date  . Wisdom tooth extraction Bilateral 2007     Family History: Family History  Problem Relation Age of Onset  . Hypothyroidism Mother   . Migraines Mother   . Lymphoma Maternal Grandmother   . Heart attack Maternal Grandmother   . COPD Maternal Grandmother   . Atrial fibrillation Maternal Grandfather   . COPD Maternal Grandfather   . Pulmonary Hypertension Maternal Grandfather   . Hypertension Father   . Lymphoma Paternal Grandfather     died age 27  . Hypertension Paternal Grandmother   .  Hyperlipidemia Paternal Grandmother     Social History: History  Substance Use Topics  . Smoking status: Never Smoker   . Smokeless tobacco: Never Used  . Alcohol Use: No    Allergies:  Allergies  Allergen Reactions  . Codeine Other (See Comments)    Other reaction(s): Hallucinations  . Latex Itching and Swelling    Meds:  Prescriptions prior to admission  Medication Sig Dispense Refill Last Dose  . acetaminophen (TYLENOL) 500 MG tablet Take 1,000 mg by mouth every 6 (six) hours as needed for headache.   Past Month at Unknown time  . Doxylamine-Pyridoxine (DICLEGIS) 10-10 MG TBEC Take 2 tablets by mouth at bedtime. 60 tablet 1 10/28/2014 at Unknown time  . Prenatal Vit-Fe Fumarate-FA (PRENATAL MULTIVITAMIN) TABS tablet Take 1 tablet by mouth daily at 12 noon.   Past Week at Unknown time  . promethazine (PHENERGAN) 25 MG tablet Take 1 tablet (25 mg total) by mouth every 6 (six) hours as needed for nausea or vomiting. (Patient not taking: Reported on 10/29/2014) 30 tablet 0 Not Taking at Unknown time  . [DISCONTINUED] ondansetron (ZOFRAN) 8 MG tablet Take 1 tablet (8 mg  total) by mouth every 8 (eight) hours as needed for nausea or vomiting. (Patient not taking: Reported on 10/29/2014) 30 tablet 2 Not Taking at Unknown time    ROS: Pertinent findings in history of present illness.  Physical Exam  Blood pressure 107/66, pulse 85, temperature 98.9 F (37.2 C), temperature source Oral, resp. rate 18, weight 67.132 kg (148 lb), last menstrual period 03/24/2014. GENERAL: Well-developed, well-nourished female in no acute distress. Fatigued.  HEENT: normocephalic; EOMI HEART: normal rate RESP: normal effort ABDOMEN: Soft, non-tender, gravid appropriate for gestational age EXTREMITIES: Nontender, no edema; pulses equal and symmetric; cap refill ~1.5sec NEURO: alert and oriented  FHT:  Baseline 130 Contractions: none   Labs: Results for orders placed or performed during the hospital  encounter of 10/29/14 (from the past 24 hour(s))  Urinalysis, Routine w reflex microscopic     Status: Abnormal   Collection Time: 10/29/14  5:30 PM  Result Value Ref Range   Color, Urine YELLOW YELLOW   APPearance CLEAR CLEAR   Specific Gravity, Urine 1.025 1.005 - 1.030   pH 6.0 5.0 - 8.0   Glucose, UA NEGATIVE NEGATIVE mg/dL   Hgb urine dipstick NEGATIVE NEGATIVE   Bilirubin Urine NEGATIVE NEGATIVE   Ketones, ur 40 (A) NEGATIVE mg/dL   Protein, ur NEGATIVE NEGATIVE mg/dL   Urobilinogen, UA 0.2 0.0 - 1.0 mg/dL   Nitrite NEGATIVE NEGATIVE   Leukocytes, UA NEGATIVE NEGATIVE    Imaging:  No results found. MAU Course: Given Zofran and 1L lactated ringers >> status significantly improved  Assessment: 1. Gastroenteritis   Dehydration 2/2 Viral gastroenteritis: patient w/ 5 episodes each of vomiting and diarrhea. Having little to no success w/ any PO intake. Had dramatic improvement w/ IV fluids and zofran.   Plan: Discharge home Provided script for PO zofran.  Encouraged to keep fluid intake high Labor precautions and fetal kick counts  Plan: Discharge home Labor precautions and fetal kick counts     Follow-up Information    Follow up In 1 week.       Medication List    TAKE these medications        acetaminophen 500 MG tablet  Commonly known as:  TYLENOL  Take 1,000 mg by mouth every 6 (six) hours as needed for headache.     Doxylamine-Pyridoxine 10-10 MG Tbec  Commonly known as:  DICLEGIS  Take 2 tablets by mouth at bedtime.     ondansetron 8 MG tablet  Commonly known as:  ZOFRAN  Take 1 tablet (8 mg total) by mouth every 8 (eight) hours as needed for nausea or vomiting.     prenatal multivitamin Tabs tablet  Take 1 tablet by mouth daily at 12 noon.     promethazine 25 MG tablet  Commonly known as:  PHENERGAN  Take 1 tablet (25 mg total) by mouth every 6 (six) hours as needed for nausea or vomiting.        Catherine Moreno 10/29/2014 8:23 PM   OB  fellow attestation:  I have seen and examined this patient; I agree with above documentation in the resident's note.   Catherine Moreno is a 29 y.o. G1P0 reporting nausea/vomiting, diarrhea +FM, denies LOF, VB, contractions, vaginal discharge.  PE: BP 100/57 mmHg  Pulse 63  Temp(Src) 98.3 F (36.8 C) (Oral)  Resp 16  Wt 148 lb (67.132 kg)  LMP 03/24/2014 Gen: calm comfortable, NAD Resp: normal effort, no distress Abd: gravid  ROS, labs, PMH reviewed NST reactive  Plan: - fetal kick counts reinforced, preterm labor precautions - continue routine follow up in OB clinic  Gastroentritis: s/p IV fluids, PO zofran, now feeling much better, did not experience any contractions, no signs of preterm labor.  Tolerating PO well prior to discharge.  Discussed reasons to return including worsening of vomiting/PO intolerance, worsening of diarrhea or persistence > 2-3days, bloody diarrhea and fever  Conal Shetley ROCIO, Moreno 7:55 PM

## 2014-11-04 ENCOUNTER — Ambulatory Visit (INDEPENDENT_AMBULATORY_CARE_PROVIDER_SITE_OTHER): Payer: BLUE CROSS/BLUE SHIELD | Admitting: Advanced Practice Midwife

## 2014-11-04 VITALS — BP 115/71 | HR 70 | Wt 152.0 lb

## 2014-11-04 DIAGNOSIS — Z3403 Encounter for supervision of normal first pregnancy, third trimester: Secondary | ICD-10-CM

## 2014-11-04 MED ORDER — BREAST PUMP MISC: Status: AC

## 2014-11-05 ENCOUNTER — Encounter: Payer: Self-pay | Admitting: Advanced Practice Midwife

## 2014-11-05 NOTE — Patient Instructions (Signed)
Third Trimester of Pregnancy The third trimester is from week 29 through week 42, months 7 through 9. The third trimester is a time when the fetus is growing rapidly. At the end of the ninth month, the fetus is about 20 inches in length and weighs 6-10 pounds.  BODY CHANGES Your body goes through many changes during pregnancy. The changes vary from woman to woman.   Your weight will continue to increase. You can expect to gain 25-35 pounds (11-16 kg) by the end of the pregnancy.  You may begin to get stretch marks on your hips, abdomen, and breasts.  You may urinate more often because the fetus is moving lower into your pelvis and pressing on your bladder.  You may develop or continue to have heartburn as a result of your pregnancy.  You may develop constipation because certain hormones are causing the muscles that push waste through your intestines to slow down.  You may develop hemorrhoids or swollen, bulging veins (varicose veins).  You may have pelvic pain because of the weight gain and pregnancy hormones relaxing your joints between the bones in your pelvis. Backaches may result from overexertion of the muscles supporting your posture.  You may have changes in your hair. These can include thickening of your hair, rapid growth, and changes in texture. Some women also have hair loss during or after pregnancy, or hair that feels dry or thin. Your hair will most likely return to normal after your baby is born.  Your breasts will continue to grow and be tender. A yellow discharge may leak from your breasts called colostrum.  Your belly button may stick out.  You may feel short of breath because of your expanding uterus.  You may notice the fetus "dropping," or moving lower in your abdomen.  You may have a bloody mucus discharge. This usually occurs a few days to a week before labor begins.  Your cervix becomes thin and soft (effaced) near your due date. WHAT TO EXPECT AT YOUR PRENATAL  EXAMS  You will have prenatal exams every 2 weeks until week 36. Then, you will have weekly prenatal exams. During a routine prenatal visit:  You will be weighed to make sure you and the fetus are growing normally.  Your blood pressure is taken.  Your abdomen will be measured to track your baby's growth.  The fetal heartbeat will be listened to.  Any test results from the previous visit will be discussed.  You may have a cervical check near your due date to see if you have effaced. At around 36 weeks, your caregiver will check your cervix. At the same time, your caregiver will also perform a test on the secretions of the vaginal tissue. This test is to determine if a type of bacteria, Group B streptococcus, is present. Your caregiver will explain this further. Your caregiver may ask you:  What your birth plan is.  How you are feeling.  If you are feeling the baby move.  If you have had any abnormal symptoms, such as leaking fluid, bleeding, severe headaches, or abdominal cramping.  If you have any questions. Other tests or screenings that may be performed during your third trimester include:  Blood tests that check for low iron levels (anemia).  Fetal testing to check the health, activity level, and growth of the fetus. Testing is done if you have certain medical conditions or if there are problems during the pregnancy. FALSE LABOR You may feel small, irregular contractions that   eventually go away. These are called Braxton Hicks contractions, or false labor. Contractions may last for hours, days, or even weeks before true labor sets in. If contractions come at regular intervals, intensify, or become painful, it is best to be seen by your caregiver.  SIGNS OF LABOR   Menstrual-like cramps.  Contractions that are 5 minutes apart or less.  Contractions that start on the top of the uterus and spread down to the lower abdomen and back.  A sense of increased pelvic pressure or back  pain.  A watery or bloody mucus discharge that comes from the vagina. If you have any of these signs before the 37th week of pregnancy, call your caregiver right away. You need to go to the hospital to get checked immediately. HOME CARE INSTRUCTIONS   Avoid all smoking, herbs, alcohol, and unprescribed drugs. These chemicals affect the formation and growth of the baby.  Follow your caregiver's instructions regarding medicine use. There are medicines that are either safe or unsafe to take during pregnancy.  Exercise only as directed by your caregiver. Experiencing uterine cramps is a good sign to stop exercising.  Continue to eat regular, healthy meals.  Wear a good support bra for breast tenderness.  Do not use hot tubs, steam rooms, or saunas.  Wear your seat belt at all times when driving.  Avoid raw meat, uncooked cheese, cat litter boxes, and soil used by cats. These carry germs that can cause birth defects in the baby.  Take your prenatal vitamins.  Try taking a stool softener (if your caregiver approves) if you develop constipation. Eat more high-fiber foods, such as fresh vegetables or fruit and whole grains. Drink plenty of fluids to keep your urine clear or pale yellow.  Take warm sitz baths to soothe any pain or discomfort caused by hemorrhoids. Use hemorrhoid cream if your caregiver approves.  If you develop varicose veins, wear support hose. Elevate your feet for 15 minutes, 3-4 times a day. Limit salt in your diet.  Avoid heavy lifting, wear low heal shoes, and practice good posture.  Rest a lot with your legs elevated if you have leg cramps or low back pain.  Visit your dentist if you have not gone during your pregnancy. Use a soft toothbrush to brush your teeth and be gentle when you floss.  A sexual relationship may be continued unless your caregiver directs you otherwise.  Do not travel far distances unless it is absolutely necessary and only with the approval  of your caregiver.  Take prenatal classes to understand, practice, and ask questions about the labor and delivery.  Make a trial run to the hospital.  Pack your hospital bag.  Prepare the baby's nursery.  Continue to go to all your prenatal visits as directed by your caregiver. SEEK MEDICAL CARE IF:  You are unsure if you are in labor or if your water has broken.  You have dizziness.  You have mild pelvic cramps, pelvic pressure, or nagging pain in your abdominal area.  You have persistent nausea, vomiting, or diarrhea.  You have a bad smelling vaginal discharge.  You have pain with urination. SEEK IMMEDIATE MEDICAL CARE IF:   You have a fever.  You are leaking fluid from your vagina.  You have spotting or bleeding from your vagina.  You have severe abdominal cramping or pain.  You have rapid weight loss or gain.  You have shortness of breath with chest pain.  You notice sudden or extreme swelling   of your face, hands, ankles, feet, or legs.  You have not felt your baby move in over an hour.  You have severe headaches that do not go away with medicine.  You have vision changes. Document Released: 09/07/2001 Document Revised: 09/18/2013 Document Reviewed: 11/14/2012 Exit[employer] Patient Information 2015 [employer]. This information is not intended to replace advice given to you by your health care provider. Make sure you discuss any questions you have with your health care provider.  

## 2014-11-05 NOTE — Progress Notes (Signed)
Doing well. Good fetal movement. Informed glucola normal

## 2014-11-07 ENCOUNTER — Ambulatory Visit (INDEPENDENT_AMBULATORY_CARE_PROVIDER_SITE_OTHER): Payer: BLUE CROSS/BLUE SHIELD | Admitting: Physician Assistant

## 2014-11-07 ENCOUNTER — Encounter (INDEPENDENT_AMBULATORY_CARE_PROVIDER_SITE_OTHER): Payer: Self-pay

## 2014-11-07 ENCOUNTER — Encounter (HOSPITAL_COMMUNITY): Payer: Self-pay | Admitting: Physician Assistant

## 2014-11-07 VITALS — BP 108/69 | HR 72 | Ht 69.0 in | Wt 152.0 lb

## 2014-11-07 DIAGNOSIS — F418 Other specified anxiety disorders: Secondary | ICD-10-CM
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
## 2014-11-12 NOTE — Patient Instructions (Signed)
1. Continue trying to eat 6 small meals daily rather than 3 big ones. 2. Call this office if you have any questions or concerns. 3. Continue to get regular exercise 3-5 times a week. 4. Continue to eat a healthy nutritionally balanced diet. 5. Continue to reduce stress and anxiety through activities such as yoga, mindfulness, meditation and or prayer. 6. Keep all appointments with your out patient therapist and have notes forwarded to this office. (If you do not have one and would like to be scheduled with a therapist, please let our office assist you with this. 7. Follow up as planned.

## 2014-11-15 ENCOUNTER — Ambulatory Visit (INDEPENDENT_AMBULATORY_CARE_PROVIDER_SITE_OTHER): Payer: BLUE CROSS/BLUE SHIELD | Admitting: Advanced Practice Midwife

## 2014-11-15 ENCOUNTER — Encounter: Payer: Self-pay | Admitting: Advanced Practice Midwife

## 2014-11-15 VITALS — BP 121/68 | HR 89 | Temp 97.7°F | Wt 157.0 lb

## 2014-11-15 DIAGNOSIS — Z3403 Encounter for supervision of normal first pregnancy, third trimester: Secondary | ICD-10-CM

## 2014-11-15 DIAGNOSIS — J0100 Acute maxillary sinusitis, unspecified: Secondary | ICD-10-CM

## 2014-11-15 MED ORDER — FLUTICASONE PROPIONATE 50 MCG/ACT NA SUSP: 2.0000 | Freq: Every day | NASAL | Status: AC

## 2014-11-15 MED ORDER — AZITHROMYCIN 1 G PO PACK: 1.0000 g | PACK | Freq: Once | ORAL | Status: DC

## 2014-11-15 NOTE — Progress Notes (Signed)
Discussed cramping which was yesterday, did not persist. PTL precautions. Has sinus infection x one week. Will used Flonase and ZPack. Also recommend saline sprays.

## 2014-11-15 NOTE — Patient Instructions (Signed)

## 2014-11-15 NOTE — Progress Notes (Signed)
Pt thinks she might have a sinus infection and has had a lot of abd cramping

## 2014-11-18 ENCOUNTER — Encounter: Payer: Self-pay | Admitting: Advanced Practice Midwife

## 2014-11-21 ENCOUNTER — Ambulatory Visit (HOSPITAL_COMMUNITY): Payer: Self-pay | Admitting: Physician Assistant

## 2014-11-26 ENCOUNTER — Ambulatory Visit (INDEPENDENT_AMBULATORY_CARE_PROVIDER_SITE_OTHER): Payer: BLUE CROSS/BLUE SHIELD | Admitting: Nurse Practitioner

## 2014-11-26 VITALS — BP 120/75 | HR 90 | Temp 97.3°F | Wt 161.0 lb

## 2014-11-26 DIAGNOSIS — Z3A35 35 weeks gestation of pregnancy: Secondary | ICD-10-CM

## 2014-11-26 DIAGNOSIS — O133 Gestational [pregnancy-induced] hypertension without significant proteinuria, third trimester: Secondary | ICD-10-CM

## 2014-11-26 NOTE — Progress Notes (Signed)
Increase in BP this morning and headache

## 2014-11-26 NOTE — Progress Notes (Signed)
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 She took BP which was 138/ 86 and again this morning was 140/90. She has She has history of migraine but does not feel like this is a migraine. She denies any visual changes, abdominal pains or edema. Will check PIH labs and urine for protein / creatine

## 2014-11-26 NOTE — Patient Instructions (Signed)

## 2014-11-27 LAB — COMPREHENSIVE METABOLIC PANEL
ALT: 10 U/L (ref 0–35)
AST: 14 U/L (ref 0–37)
Albumin: 3.1 g/dL — ABNORMAL LOW (ref 3.5–5.2)
Alkaline Phosphatase: 102 U/L (ref 39–117)
BUN: 10 mg/dL (ref 6–23)
CHLORIDE: 105 meq/L (ref 96–112)
CO2: 24 mEq/L (ref 19–32)
CREATININE: 0.45 mg/dL — AB (ref 0.50–1.10)
Calcium: 8.4 mg/dL (ref 8.4–10.5)
Glucose, Bld: 83 mg/dL (ref 70–99)
POTASSIUM: 4.2 meq/L (ref 3.5–5.3)
Sodium: 138 mEq/L (ref 135–145)
Total Bilirubin: 0.3 mg/dL (ref 0.2–1.2)
Total Protein: 5.6 g/dL — ABNORMAL LOW (ref 6.0–8.3)

## 2014-11-27 LAB — PROTEIN / CREATININE RATIO, URINE
CREATININE, URINE: 80.2 mg/dL
PROTEIN CREATININE RATIO: 0.24 — AB (ref <0.15)
TOTAL PROTEIN, URINE: 19 mg/dL (ref 5–24)

## 2014-11-27 LAB — CBC
HCT: 33.8 % — ABNORMAL LOW (ref 36.0–46.0)
HEMOGLOBIN: 11.1 g/dL — AB (ref 12.0–15.0)
MCH: 29.5 pg (ref 26.0–34.0)
MCHC: 32.8 g/dL (ref 30.0–36.0)
MCV: 89.9 fL (ref 78.0–100.0)
MPV: 12.5 fL — ABNORMAL HIGH (ref 8.6–12.4)
Platelets: 221 10*3/uL (ref 150–400)
RBC: 3.76 MIL/uL — ABNORMAL LOW (ref 3.87–5.11)
RDW: 13.8 % (ref 11.5–15.5)
WBC: 10.7 10*3/uL — ABNORMAL HIGH (ref 4.0–10.5)

## 2014-12-02 ENCOUNTER — Ambulatory Visit (INDEPENDENT_AMBULATORY_CARE_PROVIDER_SITE_OTHER): Payer: BLUE CROSS/BLUE SHIELD | Admitting: Advanced Practice Midwife

## 2014-12-02 DIAGNOSIS — Z36 Encounter for antenatal screening of mother: Secondary | ICD-10-CM

## 2014-12-02 DIAGNOSIS — Z3403 Encounter for supervision of normal first pregnancy, third trimester: Secondary | ICD-10-CM

## 2014-12-02 LAB — OB RESULTS CONSOLE GC/CHLAMYDIA
CHLAMYDIA, DNA PROBE: NEGATIVE
GC PROBE AMP, GENITAL: NEGATIVE

## 2014-12-02 LAB — OB RESULTS CONSOLE GBS: STREP GROUP B AG: NEGATIVE

## 2014-12-02 NOTE — Progress Notes (Signed)
Normal Pre-E labs at last visit. GBS today.

## 2014-12-02 NOTE — Patient Instructions (Signed)
Braxton Hicks Contractions °Contractions of the uterus can occur throughout pregnancy. Contractions are not always a sign that you are in labor.  °WHAT ARE BRAXTON HICKS CONTRACTIONS?  °Contractions that occur before labor are called Braxton Hicks contractions, or false labor. Toward the end of pregnancy (32-34 weeks), these contractions can develop more often and may become more forceful. This is not true labor because these contractions do not result in opening (dilatation) and thinning of the cervix. They are sometimes difficult to tell apart from true labor because these contractions can be forceful and people have different pain tolerances. You should not feel embarrassed if you go to the hospital with false labor. Sometimes, the only way to tell if you are in true labor is for your health care provider to look for changes in the cervix. °If there are no prenatal problems or other health problems associated with the pregnancy, it is completely safe to be sent home with false labor and await the onset of true labor. °HOW CAN YOU TELL THE DIFFERENCE BETWEEN TRUE AND FALSE LABOR? °False Labor °· The contractions of false labor are usually shorter and not as hard as those of true labor.   °· The contractions are usually irregular.   °· The contractions are often felt in the front of the lower abdomen and in the groin.   °· The contractions may go away when you walk around or change positions while lying down.   °· The contractions get weaker and are shorter lasting as time goes on.   °· The contractions do not usually become progressively stronger, regular, and closer together as with true labor.   °True Labor °1. Contractions in true labor last 30-70 seconds, become very regular, usually become more intense, and increase in frequency.   °2. The contractions do not go away with walking.   °3. The discomfort is usually felt in the top of the uterus and spreads to the lower abdomen and low back.   °4. True labor can  be determined by your health care provider with an exam. This will show that the cervix is dilating and getting thinner.   °WHAT TO REMEMBER °· Keep up with your usual exercises and follow other instructions given by your health care provider.   °· Take medicines as directed by your health care provider.   °· Keep your regular prenatal appointments.   °· Eat and drink lightly if you think you are going into labor.   °· If Braxton Hicks contractions are making you uncomfortable:   °· Change your position from lying down or resting to walking, or from walking to resting.   °· Sit and rest in a tub of warm water.   °· Drink 2-3 glasses of water. Dehydration may cause these contractions.   °· Do slow and deep breathing several times an hour.   °WHEN SHOULD I SEEK IMMEDIATE MEDICAL CARE? °Seek immediate medical care if: °· Your contractions become stronger, more regular, and closer together.   °· You have fluid leaking or gushing from your vagina.   °· You have a fever.   °· You pass blood-tinged mucus.   °· You have vaginal bleeding.   °· You have continuous abdominal pain.   °· You have low back pain that you never had before.   °· You feel your baby's head pushing down and causing pelvic pressure.   °· Your baby is not moving as much as it used to.   °Document Released: 09/13/2005 Document Revised: 09/18/2013 Document Reviewed: 06/25/2013 °[employer]® Patient Information ©2015 [employer]. This information is not intended to replace advice given to you by your health care   provider. Make sure you discuss any questions you have with your health care provider. ° °Fetal Movement Counts °Patient Name: __________________________________________________ Patient Due Date: ____________________ °Performing a fetal movement count is highly recommended in high-risk pregnancies, but it is good for every pregnant woman to do. Your health care provider may ask you to start counting fetal movements at 28 weeks of the pregnancy. Fetal  movements often increase: °· After eating a full meal. °· After physical activity. °· After eating or drinking something sweet or cold. °· At rest. °Pay attention to when you feel the baby is most active. This will help you notice a pattern of your baby's sleep and wake cycles and what factors contribute to an increase in fetal movement. It is important to perform a fetal movement count at the same time each day when your baby is normally most active.  °HOW TO COUNT FETAL MOVEMENTS °5. Find a quiet and comfortable area to sit or lie down on your left side. Lying on your left side provides the best blood and oxygen circulation to your baby. °6. Write down the day and time on a sheet of paper or in a journal. °7. Start counting kicks, flutters, swishes, rolls, or jabs in a 2-hour period. You should feel at least 10 movements within 2 hours. °8. If you do not feel 10 movements in 2 hours, wait 2-3 hours and count again. Look for a change in the pattern or not enough counts in 2 hours. °SEEK MEDICAL CARE IF: °· You feel less than 10 counts in 2 hours, tried twice. °· There is no movement in over an hour. °· The pattern is changing or taking longer each day to reach 10 counts in 2 hours. °· You feel the baby is not moving as he or she usually does. °Date: ____________ Movements: ____________ Start time: ____________ Finish time: ____________  °Date: ____________ Movements: ____________ Start time: ____________ Finish time: ____________ °Date: ____________ Movements: ____________ Start time: ____________ Finish time: ____________ °Date: ____________ Movements: ____________ Start time: ____________ Finish time: ____________ °Date: ____________ Movements: ____________ Start time: ____________ Finish time: ____________ °Date: ____________ Movements: ____________ Start time: ____________ Finish time: ____________ °Date: ____________ Movements: ____________ Start time: ____________ Finish time: ____________ °Date: ____________  Movements: ____________ Start time: ____________ Finish time: ____________  °Date: ____________ Movements: ____________ Start time: ____________ Finish time: ____________ °Date: ____________ Movements: ____________ Start time: ____________ Finish time: ____________ °Date: ____________ Movements: ____________ Start time: ____________ Finish time: ____________ °Date: ____________ Movements: ____________ Start time: ____________ Finish time: ____________ °Date: ____________ Movements: ____________ Start time: ____________ Finish time: ____________ °Date: ____________ Movements: ____________ Start time: ____________ Finish time: ____________ °Date: ____________ Movements: ____________ Start time: ____________ Finish time: ____________  °Date: ____________ Movements: ____________ Start time: ____________ Finish time: ____________ °Date: ____________ Movements: ____________ Start time: ____________ Finish time: ____________ °Date: ____________ Movements: ____________ Start time: ____________ Finish time: ____________ °Date: ____________ Movements: ____________ Start time: ____________ Finish time: ____________ °Date: ____________ Movements: ____________ Start time: ____________ Finish time: ____________ °Date: ____________ Movements: ____________ Start time: ____________ Finish time: ____________ °Date: ____________ Movements: ____________ Start time: ____________ Finish time: ____________  °Date: ____________ Movements: ____________ Start time: ____________ Finish time: ____________ °Date: ____________ Movements: ____________ Start time: ____________ Finish time: ____________ °Date: ____________ Movements: ____________ Start time: ____________ Finish time: ____________ °Date: ____________ Movements: ____________ Start time: ____________ Finish time: ____________ °Date: ____________ Movements: ____________ Start time: ____________ Finish time: ____________ °Date: ____________ Movements: ____________ Start time:  ____________ Finish time: ____________ °Date: ____________ Movements:   ____________ Start time: ____________ Finish time: ____________  °Date: ____________ Movements: ____________ Start time: ____________ Finish time: ____________ °Date: ____________ Movements: ____________ Start time: ____________ Finish time: ____________ °Date: ____________ Movements: ____________ Start time: ____________ Finish time: ____________ °Date: ____________ Movements: ____________ Start time: ____________ Finish time: ____________ °Date: ____________ Movements: ____________ Start time: ____________ Finish time: ____________ °Date: ____________ Movements: ____________ Start time: ____________ Finish time: ____________ °Date: ____________ Movements: ____________ Start time: ____________ Finish time: ____________  °Date: ____________ Movements: ____________ Start time: ____________ Finish time: ____________ °Date: ____________ Movements: ____________ Start time: ____________ Finish time: ____________ °Date: ____________ Movements: ____________ Start time: ____________ Finish time: ____________ °Date: ____________ Movements: ____________ Start time: ____________ Finish time: ____________ °Date: ____________ Movements: ____________ Start time: ____________ Finish time: ____________ °Date: ____________ Movements: ____________ Start time: ____________ Finish time: ____________ °Date: ____________ Movements: ____________ Start time: ____________ Finish time: ____________  °Date: ____________ Movements: ____________ Start time: ____________ Finish time: ____________ °Date: ____________ Movements: ____________ Start time: ____________ Finish time: ____________ °Date: ____________ Movements: ____________ Start time: ____________ Finish time: ____________ °Date: ____________ Movements: ____________ Start time: ____________ Finish time: ____________ °Date: ____________ Movements: ____________ Start time: ____________ Finish time: ____________ °Date:  ____________ Movements: ____________ Start time: ____________ Finish time: ____________ °Date: ____________ Movements: ____________ Start time: ____________ Finish time: ____________  °Date: ____________ Movements: ____________ Start time: ____________ Finish time: ____________ °Date: ____________ Movements: ____________ Start time: ____________ Finish time: ____________ °Date: ____________ Movements: ____________ Start time: ____________ Finish time: ____________ °Date: ____________ Movements: ____________ Start time: ____________ Finish time: ____________ °Date: ____________ Movements: ____________ Start time: ____________ Finish time: ____________ °Date: ____________ Movements: ____________ Start time: ____________ Finish time: ____________ °Document Released: 10/13/2006 Document Revised: 01/28/2014 Document Reviewed: 07/10/2012 °[employer]® Patient Information ©2015 [employer]. This information is not intended to replace advice given to you by your health care provider. Make sure you discuss any questions you have with your health care provider. ° °

## 2014-12-03 LAB — GC/CHLAMYDIA PROBE AMP
CT Probe RNA: NEGATIVE
GC Probe RNA: NEGATIVE

## 2014-12-04 LAB — CULTURE, BETA STREP (GROUP B ONLY)

## 2014-12-09 ENCOUNTER — Ambulatory Visit (INDEPENDENT_AMBULATORY_CARE_PROVIDER_SITE_OTHER): Payer: BLUE CROSS/BLUE SHIELD | Admitting: Advanced Practice Midwife

## 2014-12-09 VITALS — BP 118/72 | HR 79 | Wt 166.0 lb

## 2014-12-09 DIAGNOSIS — S301XXA Contusion of abdominal wall, initial encounter: Secondary | ICD-10-CM | POA: Diagnosis not present

## 2014-12-09 DIAGNOSIS — O26893 Other specified pregnancy related conditions, third trimester: Secondary | ICD-10-CM | POA: Diagnosis not present

## 2014-12-09 DIAGNOSIS — Z3403 Encounter for supervision of normal first pregnancy, third trimester: Secondary | ICD-10-CM

## 2014-12-09 DIAGNOSIS — R51 Headache: Secondary | ICD-10-CM

## 2014-12-09 MED ORDER — BUTALBITAL-APAP-CAFFEINE 50-325-40 MG PO CAPS: 1.0000 | ORAL_CAPSULE | Freq: Four times a day (QID) | ORAL | Status: DC | PRN

## 2014-12-09 NOTE — Progress Notes (Signed)
Pt states she is having headaches everyday. Pt also wanted to adv her dog stepped on abdomen today and was painful.

## 2014-12-09 NOTE — Patient Instructions (Addendum)
What Do I Need to Know About Injuries During Pregnancy? Injuries can happen during pregnancy. Minor falls and accidents usually do not harm you or your baby. However, any injury should be reported to your doctor. WHAT CAN I DO TO PROTECT MYSELF FROM INJURIES?  Remove rugs and loose objects on the floor.  Wear comfortable shoes that have a good grip. Do not wear high-heeled shoes.  Always wear your seat belt. The lap belt should be below your belly. Always practice safe driving.  Do not ride on a motorcycle.  Do not participate in high-impact activities or sports.  Avoid:  Walking on wet or slippery floors.  Fires.  Starting fires.  Lifting heavy pots of boiling or hot liquids.  Fixing electrical problems.  Only take medicine as told by your doctor.  Know your blood type and the blood type of the baby's father.  Call your local emergency services (911 in the [place] if you are a victim of domestic violence or assault. For help and support, contact the [employer]. WHEN SHOULD I GET HELP RIGHT AWAY?  You fall on your belly or have any high-impact accident or injury.  You have been a victim of domestic violence or any kind of violence.  You have been in a car accident.  You have bleeding from your vagina.  Fluid is leaking from your vagina.  You start to have belly cramping (contractions) or pain.  You feel weak or pass out (faint).  You start to throw up (vomit) after an injury.  You have been burned.  You have a stiff neck or neck pain.  You get a headache or have vision problems after an injury.  You do not feel the baby move or the baby is not moving as much as normal. Document Released: 10/16/2010 Document Revised: 01/28/2014 Document Reviewed: 06/20/2013 [facility] Patient Information 2015 [place], [place]. This information is not intended to replace advice given to you by your health care provider. Make sure you discuss any questions you  have with your health care provider.  Preeclampsia and Eclampsia Preeclampsia is a serious condition that develops only during pregnancy. It is also called toxemia of pregnancy. This condition causes high blood pressure along with other symptoms, such as swelling and headaches. These may develop as the condition gets worse. Preeclampsia may occur 20 weeks or later into your pregnancy.  Diagnosing and treating preeclampsia early is very important. If not treated early, it can cause serious problems for you and your baby. One problem it can lead to is eclampsia, which is a condition that causes muscle jerking or shaking (convulsions) in the mother. Delivering your baby is the best treatment for preeclampsia or eclampsia.  RISK FACTORS The cause of preeclampsia is not known. You may be more likely to develop preeclampsia if you have certain risk factors. These include:   Being pregnant for the first time.  Having preeclampsia in a past pregnancy.  Having a family history of preeclampsia.  Having high blood pressure.  Being pregnant with twins or triplets.  Being 43 or older.  Being African American.  Having kidney disease or diabetes.  Having medical conditions such as lupus or blood diseases.  Being very overweight (obese). SIGNS AND SYMPTOMS  The earliest signs of preeclampsia are:  High blood pressure.  Increased protein in your urine. Your health care provider will check for this at every prenatal visit. Other symptoms that can develop include:   Severe headaches.  Sudden weight gain.  Swelling of your hands, face, legs, and feet.  Feeling sick to your stomach (nauseous) and throwing up (vomiting).  Vision problems (blurred or double vision).  Numbness in your face, arms, legs, and feet.  Dizziness.  Slurred speech.  Sensitivity to bright lights.  Abdominal pain. DIAGNOSIS  There are no screening tests for preeclampsia. Your health care provider will ask you  about symptoms and check for signs of preeclampsia during your prenatal visits. You may also have tests, including:  Urine testing.  Blood testing.  Checking your baby's heart rate.  Checking the health of your baby and your placenta using images created with sound waves (ultrasound). TREATMENT  You can work out the best treatment approach together with your health care provider. It is very important to keep all prenatal appointments. If you have an increased risk of preeclampsia, you may need more frequent prenatal exams.  Your health care provider may prescribe bed rest.  You may have to eat as little salt as possible.  You may need to take medicine to lower your blood pressure if the condition does not respond to more conservative measures.  You may need to stay in the hospital if your condition is severe. There, treatment will focus on controlling your blood pressure and fluid retention. You may also need to take medicine to prevent seizures.  If the condition gets worse, your baby may need to be delivered early to protect you and the baby. You may have your labor started with medicine (be induced), or you may have a cesarean delivery.  Preeclampsia usually goes away after the baby is born. HOME CARE INSTRUCTIONS   Only take over-the-counter or prescription medicines as directed by your health care provider.  Lie on your left side while resting. This keeps pressure off your baby.  Elevate your feet while resting.  Get regular exercise. Ask your health care provider what type of exercise is safe for you.  Avoid caffeine and alcohol.  Do not smoke.  Drink 6-8 glasses of water every day.  Eat a balanced diet that is low in salt. Do not add salt to your food.  Avoid stressful situations as much as possible.  Get plenty of rest and sleep.  Keep all prenatal appointments and tests as scheduled. SEEK MEDICAL CARE IF:  You are gaining more weight than expected.  You have  any headaches, abdominal pain, or nausea.  You are bruising more than usual.  You feel dizzy or light-headed. SEEK IMMEDIATE MEDICAL CARE IF:   You develop sudden or severe swelling anywhere in your body. This usually happens in the legs.  You gain 5 lb (2.3 kg) or more in a week.  You have a severe headache, dizziness, problems with your vision, or confusion.  You have severe abdominal pain.  You have lasting nausea or vomiting.  You have a seizure.  You have trouble moving any part of your body.  You develop numbness in your body.  You have trouble speaking.  You have any abnormal bleeding.  You develop a stiff neck.  You pass out. MAKE SURE YOU:   Understand these instructions.  Will watch your condition.  Will get help right away if you are not doing well or get worse. Document Released: 09/10/2000 Document Revised: 09/18/2013 Document Reviewed: 07/06/2013 [facility] Patient Information 2015 [place], [place]. This information is not intended to replace advice given to you by your health care provider. Make sure you discuss any questions you have with your health care  provider.   Fetal Movement Counts Patient Name: __________________________________________________ Patient Due Date: ____________________ Performing a fetal movement count is highly recommended in high-risk pregnancies, but it is good for every pregnant woman to do. Your health care provider may ask you to start counting fetal movements at 28 weeks of the pregnancy. Fetal movements often increase:  After eating a full meal.  After physical activity.  After eating or drinking something sweet or cold.  At rest. Pay attention to when you feel the baby is most active. This will help you notice a pattern of your baby's sleep and wake cycles and what factors contribute to an increase in fetal movement. It is important to perform a fetal movement count at the same time each day when your baby is normally  most active.  HOW TO COUNT FETAL MOVEMENTS  Find a quiet and comfortable area to sit or lie down on your left side. Lying on your left side provides the best blood and oxygen circulation to your baby.  Write down the day and time on a sheet of paper or in a journal.  Start counting kicks, flutters, swishes, rolls, or jabs in a 2-hour period. You should feel at least 10 movements within 2 hours.  If you do not feel 10 movements in 2 hours, wait 2-3 hours and count again. Look for a change in the pattern or not enough counts in 2 hours. SEEK MEDICAL CARE IF:  You feel less than 10 counts in 2 hours, tried twice.  There is no movement in over an hour.  The pattern is changing or taking longer each day to reach 10 counts in 2 hours.  You feel the baby is not moving as he or she usually does. Date: ____________ Movements: ____________ Start time: ____________ Catherine MartinFinish time: ____________  Date: ____________ Movements: ____________ Start time: ____________ Catherine MartinFinish time: ____________ Date: ____________ Movements: ____________ Start time: ____________ Catherine MartinFinish time: ____________ Date: ____________ Movements: ____________ Start time: ____________ Catherine MartinFinish time: ____________ Date: ____________ Movements: ____________ Start time: ____________ Catherine MartinFinish time: ____________ Date: ____________ Movements: ____________ Start time: ____________ Catherine MartinFinish time: ____________ Date: ____________ Movements: ____________ Start time: ____________ Catherine MartinFinish time: ____________ Date: ____________ Movements: ____________ Start time: ____________ Catherine MartinFinish time: ____________  Date: ____________ Movements: ____________ Start time: ____________ Catherine MartinFinish time: ____________ Date: ____________ Movements: ____________ Start time: ____________ Catherine MartinFinish time: ____________ Date: ____________ Movements: ____________ Start time: ____________ Catherine MartinFinish time: ____________ Date: ____________ Movements: ____________ Start time: ____________ Catherine MartinFinish time:  ____________ Date: ____________ Movements: ____________ Start time: ____________ Catherine MartinFinish time: ____________ Date: ____________ Movements: ____________ Start time: ____________ Catherine MartinFinish time: ____________ Date: ____________ Movements: ____________ Start time: ____________ Catherine MartinFinish time: ____________  Date: ____________ Movements: ____________ Start time: ____________ Catherine MartinFinish time: ____________ Date: ____________ Movements: ____________ Start time: ____________ Catherine MartinFinish time: ____________ Date: ____________ Movements: ____________ Start time: ____________ Catherine MartinFinish time: ____________ Date: ____________ Movements: ____________ Start time: ____________ Catherine MartinFinish time: ____________ Date: ____________ Movements: ____________ Start time: ____________ Catherine MartinFinish time: ____________ Date: ____________ Movements: ____________ Start time: ____________ Catherine MartinFinish time: ____________ Date: ____________ Movements: ____________ Start time: ____________ Catherine MartinFinish time: ____________  Date: ____________ Movements: ____________ Start time: ____________ Catherine MartinFinish time: ____________ Date: ____________ Movements: ____________ Start time: ____________ Catherine MartinFinish time: ____________ Date: ____________ Movements: ____________ Start time: ____________ Catherine MartinFinish time: ____________ Date: ____________ Movements: ____________ Start time: ____________ Catherine MartinFinish time: ____________ Date: ____________ Movements: ____________ Start time: ____________ Catherine MartinFinish time: ____________ Date: ____________ Movements: ____________ Start time: ____________ Catherine MartinFinish time: ____________ Date: ____________ Movements: ____________ Start time: ____________ Catherine MartinFinish time: ____________  Date: ____________ Movements: ____________ Start  time: ____________ Catherine Moreno time: ____________ Date: ____________ Movements: ____________ Start time: ____________ Catherine Moreno time: ____________ Date: ____________ Movements: ____________ Start time: ____________ Catherine Moreno time: ____________ Date: ____________ Movements:  ____________ Start time: ____________ Catherine Moreno time: ____________ Date: ____________ Movements: ____________ Start time: ____________ Catherine Moreno time: ____________ Date: ____________ Movements: ____________ Start time: ____________ Catherine Moreno time: ____________ Date: ____________ Movements: ____________ Start time: ____________ Catherine Moreno time: ____________  Date: ____________ Movements: ____________ Start time: ____________ Catherine Moreno time: ____________ Date: ____________ Movements: ____________ Start time: ____________ Catherine Moreno time: ____________ Date: ____________ Movements: ____________ Start time: ____________ Catherine Moreno time: ____________ Date: ____________ Movements: ____________ Start time: ____________ Catherine Moreno time: ____________ Date: ____________ Movements: ____________ Start time: ____________ Catherine Moreno time: ____________ Date: ____________ Movements: ____________ Start time: ____________ Catherine Moreno time: ____________ Date: ____________ Movements: ____________ Start time: ____________ Catherine Moreno time: ____________  Date: ____________ Movements: ____________ Start time: ____________ Catherine Moreno time: ____________ Date: ____________ Movements: ____________ Start time: ____________ Catherine Moreno time: ____________ Date: ____________ Movements: ____________ Start time: ____________ Catherine Moreno time: ____________ Date: ____________ Movements: ____________ Start time: ____________ Catherine Moreno time: ____________ Date: ____________ Movements: ____________ Start time: ____________ Catherine Moreno time: ____________ Date: ____________ Movements: ____________ Start time: ____________ Catherine Moreno time: ____________ Date: ____________ Movements: ____________ Start time: ____________ Catherine Moreno time: ____________  Date: ____________ Movements: ____________ Start time: ____________ Catherine Moreno time: ____________ Date: ____________ Movements: ____________ Start time: ____________ Catherine Moreno time: ____________ Date: ____________ Movements: ____________ Start time: ____________ Catherine Moreno  time: ____________ Date: ____________ Movements: ____________ Start time: ____________ Catherine Moreno time: ____________ Date: ____________ Movements: ____________ Start time: ____________ Catherine Moreno time: ____________ Date: ____________ Movements: ____________ Start time: ____________ Catherine Moreno time: ____________ Document Released: 10/13/2006 Document Revised: 01/28/2014 Document Reviewed: 07/10/2012 [employer] Patient Information 2015 [place]. This information is not intended to replace advice given to you by your health care provider. Make sure you discuss any questions you have with your health care provider.

## 2014-12-09 NOTE — Progress Notes (Signed)
-  Having daily dull HA's, 3/10 on pain scale. Partial relief w/ Tylenol. No vision changes or epigastric pain. Different than Migraines. Doubt atypical Pre-E due to normal BP, normal recent labs and lack of other Sx. Will try Fioricet. Pre-E precautions. CTO closely.  -Experienced mild abs trauma this morning when dogs were rough-housing and one stepped on her abd. Occurred >4 hours ago. No VB, contractions. Was painful when it happened, but no further abd pain. Will do NST and consult attending. >>NST 135, reactive. I UC w/ UI. Per consult w/ Dr. Jolayne Pantheronstant no further eval at this time, but review FKCs., abruption Sx. -Reviewed neg GBS and cultures.

## 2014-12-16 ENCOUNTER — Encounter: Payer: Self-pay | Admitting: Obstetrics & Gynecology

## 2014-12-16 ENCOUNTER — Ambulatory Visit (INDEPENDENT_AMBULATORY_CARE_PROVIDER_SITE_OTHER): Payer: BLUE CROSS/BLUE SHIELD | Admitting: Obstetrics & Gynecology

## 2014-12-16 VITALS — BP 109/68 | HR 84 | Wt 168.0 lb

## 2014-12-16 DIAGNOSIS — Z3402 Encounter for supervision of normal first pregnancy, second trimester: Secondary | ICD-10-CM

## 2014-12-16 NOTE — Progress Notes (Signed)
Routine visit. Good FM. No problems. Lbor precautions reviewed.  

## 2014-12-23 ENCOUNTER — Ambulatory Visit (INDEPENDENT_AMBULATORY_CARE_PROVIDER_SITE_OTHER): Payer: BLUE CROSS/BLUE SHIELD | Admitting: Obstetrics & Gynecology

## 2014-12-23 VITALS — BP 116/71 | HR 83 | Wt 169.0 lb

## 2014-12-23 DIAGNOSIS — Z3403 Encounter for supervision of normal first pregnancy, third trimester: Secondary | ICD-10-CM

## 2014-12-23 NOTE — Progress Notes (Signed)
Routine visit. Good FM. Labor precautions reviewed. She declines a cervical exam today. 

## 2014-12-30 ENCOUNTER — Ambulatory Visit (INDEPENDENT_AMBULATORY_CARE_PROVIDER_SITE_OTHER): Payer: BLUE CROSS/BLUE SHIELD | Admitting: Advanced Practice Midwife

## 2014-12-30 ENCOUNTER — Encounter: Payer: Self-pay | Admitting: *Deleted

## 2014-12-30 ENCOUNTER — Encounter: Payer: Self-pay | Admitting: Advanced Practice Midwife

## 2014-12-30 VITALS — BP 119/69 | HR 85 | Wt 170.0 lb

## 2014-12-30 DIAGNOSIS — Z3403 Encounter for supervision of normal first pregnancy, third trimester: Secondary | ICD-10-CM

## 2014-12-30 NOTE — Progress Notes (Signed)
Waterbirth consent signed. Went to class. Has tub and supplies.  Declines cervical exam. Not having a lot of contractions yet.

## 2014-12-30 NOTE — Patient Instructions (Signed)

## 2015-01-02 ENCOUNTER — Ambulatory Visit (INDEPENDENT_AMBULATORY_CARE_PROVIDER_SITE_OTHER): Payer: BLUE CROSS/BLUE SHIELD | Admitting: *Deleted

## 2015-01-02 VITALS — BP 121/70 | HR 67 | Wt 173.0 lb

## 2015-01-02 DIAGNOSIS — O480 Post-term pregnancy: Secondary | ICD-10-CM | POA: Diagnosis not present

## 2015-01-02 NOTE — Progress Notes (Signed)
IOL scheduled for 01/06/15 @  7:30 pm

## 2015-01-02 NOTE — Progress Notes (Signed)
Patient ID: Catherine Moreno, female   DOB: 12/23/1985, 29 y.o.   MRN: [id number] NST reviewed and reactive.  Amra Shukla L. Harraway-Smith, M.D., Evern CoreFACOG

## 2015-01-02 NOTE — Progress Notes (Signed)
Pt here for NST only for post-dates. NST reactive/reassuring and shows contractions. Called attending, Dr Elasha FullingHarraway-Smith, to review NST. Confirmation of contractions but will send pt home due to contractions being too far apart and pt not having any pain. Explained to pt that if she felt decreased fetal movement, ROM, bleeding, etc then she would need to report to MAU. Pt expressed understanding.

## 2015-01-03 ENCOUNTER — Inpatient Hospital Stay (HOSPITAL_COMMUNITY)
Admission: AD | Admit: 2015-01-03 | Discharge: 2015-01-03 | Disposition: A | Discharge status: Home / Self Care | Payer: BLUE CROSS/BLUE SHIELD | Source: Ambulatory Visit | Attending: Family Medicine | Admitting: Family Medicine

## 2015-01-03 ENCOUNTER — Telehealth (HOSPITAL_COMMUNITY): Payer: Self-pay | Admitting: *Deleted

## 2015-01-03 ENCOUNTER — Encounter (HOSPITAL_COMMUNITY): Payer: Self-pay | Admitting: *Deleted

## 2015-01-03 DIAGNOSIS — R102 Pelvic and perineal pain: Secondary | ICD-10-CM | POA: Insufficient documentation

## 2015-01-03 DIAGNOSIS — Z3A40 40 weeks gestation of pregnancy: Secondary | ICD-10-CM | POA: Insufficient documentation

## 2015-01-03 DIAGNOSIS — O9989 Other specified diseases and conditions complicating pregnancy, childbirth and the puerperium: Secondary | ICD-10-CM | POA: Diagnosis not present

## 2015-01-03 DIAGNOSIS — M549 Dorsalgia, unspecified: Secondary | ICD-10-CM | POA: Insufficient documentation

## 2015-01-03 NOTE — MAU Note (Signed)
Pt c/o back pain and increased pelvic pressure. Denies any vag bleeding or leaking.

## 2015-01-03 NOTE — Telephone Encounter (Signed)
Preadmission screen  

## 2015-01-06 ENCOUNTER — Inpatient Hospital Stay (HOSPITAL_COMMUNITY)
Admission: RE | Admit: 2015-01-06 | Payer: BLUE CROSS/BLUE SHIELD | Source: Ambulatory Visit | Admitting: Family Medicine

## 2015-01-06 ENCOUNTER — Inpatient Hospital Stay (HOSPITAL_COMMUNITY)
Admission: AD | Admit: 2015-01-06 | Discharge: 2015-01-08 | DRG: 775 | Disposition: A | Discharge status: Home / Self Care | Payer: BLUE CROSS/BLUE SHIELD | Attending: Family Medicine | Admitting: Family Medicine

## 2015-01-06 ENCOUNTER — Encounter (HOSPITAL_COMMUNITY): Payer: Self-pay | Admitting: *Deleted

## 2015-01-06 ENCOUNTER — Inpatient Hospital Stay (HOSPITAL_COMMUNITY): Payer: BLUE CROSS/BLUE SHIELD | Admitting: Anesthesiology

## 2015-01-06 DIAGNOSIS — O4292 Full-term premature rupture of membranes, unspecified as to length of time between rupture and onset of labor: Secondary | ICD-10-CM

## 2015-01-06 DIAGNOSIS — O480 Post-term pregnancy: Secondary | ICD-10-CM | POA: Diagnosis present

## 2015-01-06 DIAGNOSIS — Z3A41 41 weeks gestation of pregnancy: Secondary | ICD-10-CM | POA: Diagnosis not present

## 2015-01-06 DIAGNOSIS — O4202 Full-term premature rupture of membranes, onset of labor within 24 hours of rupture: Secondary | ICD-10-CM

## 2015-01-06 DIAGNOSIS — O9952 Diseases of the respiratory system complicating childbirth: Secondary | ICD-10-CM | POA: Diagnosis present

## 2015-01-06 DIAGNOSIS — J45909 Unspecified asthma, uncomplicated: Secondary | ICD-10-CM | POA: Diagnosis present

## 2015-01-06 DIAGNOSIS — O4290 Premature rupture of membranes, unspecified as to length of time between rupture and onset of labor, unspecified weeks of gestation: Secondary | ICD-10-CM | POA: Diagnosis present

## 2015-01-06 DIAGNOSIS — N858 Other specified noninflammatory disorders of uterus: Secondary | ICD-10-CM | POA: Diagnosis present

## 2015-01-06 LAB — CBC
HCT: 31.4 % — ABNORMAL LOW (ref 36.0–46.0)
HEMOGLOBIN: 10.5 g/dL — AB (ref 12.0–15.0)
MCH: 29.7 pg (ref 26.0–34.0)
MCHC: 33.4 g/dL (ref 30.0–36.0)
MCV: 89 fL (ref 78.0–100.0)
Platelets: 184 10*3/uL (ref 150–400)
RBC: 3.53 MIL/uL — AB (ref 3.87–5.11)
RDW: 15.2 % (ref 11.5–15.5)
WBC: 11.4 10*3/uL — ABNORMAL HIGH (ref 4.0–10.5)

## 2015-01-06 LAB — POCT FERN TEST: POCT Fern Test: POSITIVE

## 2015-01-06 LAB — RPR: RPR Ser Ql: NONREACTIVE

## 2015-01-06 LAB — TYPE AND SCREEN
ABO/RH(D): A POS
Antibody Screen: NEGATIVE

## 2015-01-06 LAB — ABO/RH: ABO/RH(D): A POS

## 2015-01-06 MED ORDER — ACETAMINOPHEN 325 MG PO TABS: 650.0000 mg | ORAL_TABLET | ORAL | Status: DC | PRN

## 2015-01-06 MED ORDER — PHENYLEPHRINE 40 MCG/ML (10ML) SYRINGE FOR IV PUSH (FOR BLOOD PRESSURE SUPPORT)
80.0000 ug | PREFILLED_SYRINGE | INTRAVENOUS | Status: DC | PRN
  Filled 2015-01-06: qty 2

## 2015-01-06 MED ORDER — LACTATED RINGERS IV SOLN
500.0000 mL | Freq: Once | INTRAVENOUS | Status: AC
  Administered 2015-01-06: 500 mL via INTRAVENOUS

## 2015-01-06 MED ORDER — ONDANSETRON HCL 4 MG PO TABS: 4.0000 mg | ORAL_TABLET | ORAL | Status: DC | PRN

## 2015-01-06 MED ORDER — EPHEDRINE 5 MG/ML INJ
10.0000 mg | INTRAVENOUS | Status: DC | PRN
  Filled 2015-01-06: qty 2

## 2015-01-06 MED ORDER — LIDOCAINE HCL (PF) 1 % IJ SOLN
INTRAMUSCULAR | Status: DC | PRN
  Administered 2015-01-06: 6 mL
  Administered 2015-01-06: 4 mL

## 2015-01-06 MED ORDER — SIMETHICONE 80 MG PO CHEW
80.0000 mg | CHEWABLE_TABLET | ORAL | Status: DC | PRN
  Administered 2015-01-07: 80 mg via ORAL
  Filled 2015-01-06: qty 1

## 2015-01-06 MED ORDER — TETANUS-DIPHTH-ACELL PERTUSSIS 5-2.5-18.5 LF-MCG/0.5 IM SUSP: 0.5000 mL | Freq: Once | INTRAMUSCULAR | Status: DC

## 2015-01-06 MED ORDER — ZOLPIDEM TARTRATE 5 MG PO TABS: 5.0000 mg | ORAL_TABLET | Freq: Every evening | ORAL | Status: DC | PRN

## 2015-01-06 MED ORDER — DIPHENHYDRAMINE HCL 50 MG/ML IJ SOLN: 12.5000 mg | INTRAMUSCULAR | Status: DC | PRN

## 2015-01-06 MED ORDER — LACTATED RINGERS IV SOLN
500.0000 mL | INTRAVENOUS | Status: DC | PRN
  Administered 2015-01-06: 350 mL via INTRAVENOUS
  Administered 2015-01-06: 500 mL via INTRAVENOUS

## 2015-01-06 MED ORDER — ONDANSETRON HCL 4 MG/2ML IJ SOLN: 4.0000 mg | INTRAMUSCULAR | Status: DC | PRN

## 2015-01-06 MED ORDER — BENZOCAINE-MENTHOL 20-0.5 % EX AERO
1.0000 "application " | INHALATION_SPRAY | CUTANEOUS | Status: DC | PRN
  Administered 2015-01-06: 1 via TOPICAL
  Filled 2015-01-06: qty 56

## 2015-01-06 MED ORDER — LACTATED RINGERS IV SOLN
INTRAVENOUS | Status: DC
  Administered 2015-01-06 (×2): via INTRAVENOUS

## 2015-01-06 MED ORDER — PRENATAL MULTIVITAMIN CH
1.0000 | ORAL_TABLET | Freq: Every day | ORAL | Status: DC
  Administered 2015-01-07 – 2015-01-08 (×2): 1 via ORAL
  Filled 2015-01-06 (×2): qty 1

## 2015-01-06 MED ORDER — PROMETHAZINE HCL 25 MG/ML IJ SOLN
25.0000 mg | Freq: Four times a day (QID) | INTRAMUSCULAR | Status: DC | PRN
  Administered 2015-01-06: 25 mg via INTRAVENOUS
  Filled 2015-01-06: qty 1

## 2015-01-06 MED ORDER — OXYCODONE-ACETAMINOPHEN 5-325 MG PO TABS: 2.0000 | ORAL_TABLET | ORAL | Status: DC | PRN

## 2015-01-06 MED ORDER — WITCH HAZEL-GLYCERIN EX PADS: 1.0000 "application " | MEDICATED_PAD | CUTANEOUS | Status: DC | PRN

## 2015-01-06 MED ORDER — PHENYLEPHRINE 40 MCG/ML (10ML) SYRINGE FOR IV PUSH (FOR BLOOD PRESSURE SUPPORT)
80.0000 ug | PREFILLED_SYRINGE | INTRAVENOUS | Status: DC | PRN
  Filled 2015-01-06: qty 20
  Filled 2015-01-06: qty 2

## 2015-01-06 MED ORDER — FENTANYL 2.5 MCG/ML BUPIVACAINE 1/10 % EPIDURAL INFUSION (WH - ANES)
14.0000 mL/h | INTRAMUSCULAR | Status: DC | PRN
  Administered 2015-01-06 (×2): 14 mL/h via EPIDURAL
  Filled 2015-01-06 (×2): qty 125

## 2015-01-06 MED ORDER — DIBUCAINE 1 % RE OINT: 1.0000 "application " | TOPICAL_OINTMENT | RECTAL | Status: DC | PRN

## 2015-01-06 MED ORDER — OXYTOCIN 40 UNITS IN LACTATED RINGERS INFUSION - SIMPLE MED
62.5000 mL/h | INTRAVENOUS | Status: DC
  Filled 2015-01-06: qty 1000

## 2015-01-06 MED ORDER — CITRIC ACID-SODIUM CITRATE 334-500 MG/5ML PO SOLN: 30.0000 mL | ORAL | Status: DC | PRN

## 2015-01-06 MED ORDER — OXYCODONE-ACETAMINOPHEN 5-325 MG PO TABS: 1.0000 | ORAL_TABLET | ORAL | Status: DC | PRN

## 2015-01-06 MED ORDER — FENTANYL CITRATE 0.05 MG/ML IJ SOLN
75.0000 ug | Freq: Once | INTRAMUSCULAR | Status: AC
  Administered 2015-01-06: 75 ug via INTRAVENOUS

## 2015-01-06 MED ORDER — SENNOSIDES-DOCUSATE SODIUM 8.6-50 MG PO TABS
2.0000 | ORAL_TABLET | ORAL | Status: DC
  Administered 2015-01-06 – 2015-01-07 (×2): 2 via ORAL
  Filled 2015-01-06 (×2): qty 2

## 2015-01-06 MED ORDER — FENTANYL CITRATE 0.05 MG/ML IJ SOLN
100.0000 ug | INTRAMUSCULAR | Status: DC | PRN
  Administered 2015-01-06: 100 ug via INTRAVENOUS
  Filled 2015-01-06 (×2): qty 2

## 2015-01-06 MED ORDER — OXYTOCIN 40 UNITS IN LACTATED RINGERS INFUSION - SIMPLE MED
1.0000 m[IU]/min | INTRAVENOUS | Status: DC
  Administered 2015-01-06: 2 m[IU]/min via INTRAVENOUS

## 2015-01-06 MED ORDER — OXYTOCIN BOLUS FROM INFUSION
500.0000 mL | INTRAVENOUS | Status: DC
  Administered 2015-01-06: 500 mL via INTRAVENOUS

## 2015-01-06 MED ORDER — ACETAMINOPHEN 325 MG PO TABS
650.0000 mg | ORAL_TABLET | ORAL | Status: DC | PRN
  Administered 2015-01-07: 650 mg via ORAL
  Filled 2015-01-06: qty 2

## 2015-01-06 MED ORDER — LANOLIN HYDROUS EX OINT: TOPICAL_OINTMENT | CUTANEOUS | Status: DC | PRN

## 2015-01-06 MED ORDER — LIDOCAINE HCL (PF) 1 % IJ SOLN
30.0000 mL | INTRAMUSCULAR | Status: DC | PRN
  Filled 2015-01-06: qty 30

## 2015-01-06 MED ORDER — IBUPROFEN 600 MG PO TABS
600.0000 mg | ORAL_TABLET | Freq: Four times a day (QID) | ORAL | Status: DC
  Administered 2015-01-06 – 2015-01-08 (×6): 600 mg via ORAL
  Filled 2015-01-06 (×7): qty 1

## 2015-01-06 MED ORDER — DIPHENHYDRAMINE HCL 25 MG PO CAPS: 25.0000 mg | ORAL_CAPSULE | Freq: Four times a day (QID) | ORAL | Status: DC | PRN

## 2015-01-06 MED ORDER — TERBUTALINE SULFATE 1 MG/ML IJ SOLN
0.2500 mg | Freq: Once | INTRAMUSCULAR | Status: DC | PRN
  Filled 2015-01-06: qty 1

## 2015-01-06 MED ORDER — ONDANSETRON HCL 4 MG/2ML IJ SOLN
4.0000 mg | Freq: Four times a day (QID) | INTRAMUSCULAR | Status: DC | PRN
  Administered 2015-01-06 (×2): 4 mg via INTRAVENOUS
  Filled 2015-01-06 (×3): qty 2

## 2015-01-06 NOTE — Anesthesia Preprocedure Evaluation (Addendum)
Anesthesia Evaluation  Patient identified by MRN, date of birth, ID band Patient awake    Reviewed: Allergy & Precautions, H&P , Patient's Chart, lab work & pertinent test results  Airway Mallampati: II  TM Distance: >3 FB Neck ROM: full    Dental no notable dental hx.    Pulmonary asthma ,  breath sounds clear to auscultation  Pulmonary exam normal       Cardiovascular Exercise Tolerance: Good Rhythm:regular Rate:Normal     Neuro/Psych    GI/Hepatic   Endo/Other    Renal/GU      Musculoskeletal   Abdominal   Peds  Hematology   Anesthesia Other Findings Dysrhythmia..... SVT, No Current sx; No Meds    Reproductive/Obstetrics                           Anesthesia Physical Anesthesia Plan  ASA: II  Anesthesia Plan: Epidural   Post-op Pain Management:    Induction:   Airway Management Planned:   Additional Equipment:   Intra-op Plan:   Post-operative Plan:   Informed Consent: I have reviewed the patients History and Physical, chart, labs and discussed the procedure including the risks, benefits and alternatives for the proposed anesthesia with the patient or authorized representative who has indicated his/her understanding and acceptance.   Dental Advisory Given  Plan Discussed with:   Anesthesia Plan Comments: (Labs checked- platelets confirmed with RN in room. Fetal heart tracing, per RN, reported to be stable enough for sitting procedure. Discussed epidural, and patient consents to the procedure:  included risk of possible headache,backache, failed block, allergic reaction, and nerve injury. This patient was asked if she had any questions or concerns before the procedure started.)        Anesthesia Quick Evaluation

## 2015-01-06 NOTE — Progress Notes (Signed)
Catherine Moreno is a 29 y.o. G1P0 at 5050w1d by LMP admitted for active labor  Subjective: Patient reports increase in pain.  She is asking for an epidural.  Objective: BP 133/83 mmHg  Pulse 89  Temp(Src) 98.2 F (36.8 C) (Oral)  Resp 20  Ht 5\' 9"  (1.753 m)  Wt 176 lb (79.833 kg)  BMI 25.98 kg/m2  SpO2 100%  LMP 03/24/2014      FHT:  FHR: 130 bpm, variability: moderate,  accelerations:  Present,  decelerations:  Present early UC:   irregular SVE:   Dilation: 4 Effacement (%): 70 Station: -1, -2 Exam by:: Rogelio SeenMcCall, RN   Labs: Lab Results  Component Value Date   WBC 11.4* 01/06/2015   HGB 10.5* 01/06/2015   HCT 31.4* 01/06/2015   MCV 89.0 01/06/2015   PLT 184 01/06/2015    Assessment / Plan: Spontaneous labor, progressing normally  Labor: Progressing normally Fetal Wellbeing:  Category I Pain Control:  Epidural I/D:  n/a Anticipated MOD:  NSVD  Raliegh IpGottschalk, Siearra Amberg M, DO 01/06/2015, 9:12 AM

## 2015-01-06 NOTE — Progress Notes (Signed)
LABOR PROGRESS NOTE  Catherine Moreno is a 29 y.o. G1P0 at 2519w1d  admitted for rupture of membranes  Subjective: Comfortable with epidural. Not feeling any vaginal pressure.   Objective: BP 146/81 mmHg  Pulse 92  Temp(Src) 98.7 F (37.1 C) (Oral)  Resp 20  Ht 5\' 9"  (1.753 m)  Wt 176 lb (79.833 kg)  BMI 25.98 kg/m2  SpO2 98%  LMP 03/24/2014 or  Filed Vitals:   01/06/15 1358 01/06/15 1531 01/06/15 1603 01/06/15 1629  BP: 124/78 121/73 140/81 146/81  Pulse: 97 95 87 92  Temp: 98.4 F (36.9 C)  98.7 F (37.1 C)   TempSrc: Axillary  Oral   Resp:      Height:      Weight:      SpO2:           FHT:  FHR: 125 bpm, variability: moderate,  accelerations:  Present,  decelerations:  Absent UC:   regular, every 5-6 minutes SVE:   Dilation: 10 Effacement (%): 100 Station: +1 Exam by:: H Koran RNC  Dilation: 10 Dilation Complete Date: 01/06/15 Dilation Complete Time: 1405 Effacement (%): 100 Cervical Position: Middle Station: +1 Presentation: Vertex Exam by:: H Koran RNC  Pitocin n/a  Labs: Lab Results  Component Value Date   WBC 11.4* 01/06/2015   HGB 10.5* 01/06/2015   HCT 31.4* 01/06/2015   MCV 89.0 01/06/2015   PLT 184 01/06/2015    Assessment / Plan: Spontaneous labor, progressing normally  Labor: Progressing normally. Progressed to complete without augmentation, but now contractions have spaced to every 5-6 minutes. Will start pitocin and start pushing.  Fetal Wellbeing:  Category I Pain Control:  Epidural Anticipated MOD:  NSVD  William DaltonMcEachern, Vikas Wegmann, MD 01/06/2015, 4:41 PM

## 2015-01-06 NOTE — Progress Notes (Signed)
LABOR PROGRESS NOTE  Catherine Moreno is a 29 y.o. G1P0 at 4339w1d  admitted for rupture of membranes  Subjective: Comfortable with epidural.   Objective: BP 128/79 mmHg  Pulse 99  Temp(Src) 98.9 F (37.2 C) (Oral)  Resp 20  Ht 5\' 9"  (1.753 m)  Wt 176 lb (79.833 kg)  BMI 25.98 kg/m2  SpO2 98%  LMP 03/24/2014 or  Filed Vitals:   01/06/15 1154 01/06/15 1225 01/06/15 1257 01/06/15 1332  BP: 135/79 125/80 121/77 128/79  Pulse: 80 90 90 99  Temp: 98.9 F (37.2 C)     TempSrc: Oral     Resp:      Height:      Weight:      SpO2:           FHT:  FHR: 125 bpm, variability: moderate,  accelerations:  Present,  decelerations:  Absent UC:   regular, every 3-4 minutes SVE:   Dilation: 6.5 Effacement (%): 100 Station: -1 Exam by:: H Koran RNC  Dilation: 6.5 Effacement (%): 100 Cervical Position: Middle Station: -1 Presentation: Vertex Exam by:: H Koran RNC  Pitocin n/a  Labs: Lab Results  Component Value Date   WBC 11.4* 01/06/2015   HGB 10.5* 01/06/2015   HCT 31.4* 01/06/2015   MCV 89.0 01/06/2015   PLT 184 01/06/2015    Assessment / Plan: Spontaneous labor, progressing normally  Labor: Progressing normally. Continue expectant management for now. If no cervical change at next check, plan to start pitocin.  Fetal Wellbeing:  Category I Pain Control:  Epidural Anticipated MOD:  NSVD  William DaltonMcEachern, Samaira Holzworth, MD 01/06/2015, 1:49 PM

## 2015-01-06 NOTE — MAU Note (Signed)
Patient presents to MAU with c/o leaking clear fluid since 2am with contractions every 2-3 minutes. Denies VB at this time. +FM. Patients pants notably wet. Fern Slide obtained.

## 2015-01-06 NOTE — H&P (Signed)
Catherine Moreno is a 29 y.o. female presenting for leaking fluid and contractions. She reports that her contractions started around 8pm but were mild and she was able to sleep with them. Around 1:30am she woke up because of the pain from her contractions and her water broke at 2am. Her contractions then became regular about every 3 minutes and lasting 1 minute each. She denies bleeding. Reports somewhat decreased fetal movement since contractions started but still feeling movement.   Maternal Medical History:  Reason for admission: Rupture of membranes and contractions.   Contractions: Onset was 6-12 hours ago.   Frequency: regular.   Perceived severity is moderate.    Fetal activity: Perceived fetal activity is decreased.   Last perceived fetal movement was within the past hour.    Prenatal complications: No bleeding, PIH, pre-eclampsia or substance abuse.   Prenatal Complications - Diabetes: none.    OB History    Gravida Para Term Preterm AB TAB SAB Ectopic Multiple Living   1              Past Medical History  Diagnosis Date  . Dysrhythmia     svt  . Hx of migraines   . Abnormal Pap smear of cervix     repeat was neg  . Asthma   . Environmental allergies    Past Surgical History  Procedure Laterality Date  . Wisdom tooth extraction Bilateral 2007   Family History: family history includes Atrial fibrillation in her maternal grandfather; COPD in her maternal grandfather and maternal grandmother; Heart attack in her maternal grandmother; Hyperlipidemia in her paternal grandmother; Hypertension in her father and paternal grandmother; Hypothyroidism in her mother; Lymphoma in her maternal grandmother and paternal grandfather; Migraines in her mother; Pulmonary Hypertension in her maternal grandfather. Social History:  reports that she has never smoked. She has never used smokeless tobacco. She reports that she does not drink alcohol or use illicit drugs.   Prenatal Transfer Tool   Maternal Diabetes: No Genetic Screening: Declined Maternal Ultrasounds/Referrals: Normal Fetal Ultrasounds or other Referrals:  None Maternal Substance Abuse:  No Significant Maternal Medications:  None Significant Maternal Lab Results:  Lab values include: Group B Strep negative Other Comments:  None  ROS See HPI  Dilation: 2 Effacement (%): 70, 80 Station: -2 Exam by:: CRoxan Hockey RN Blood pressure 139/83, pulse 79, temperature 98 F (36.7 C), resp. rate 18, last menstrual period 03/24/2014, SpO2 99 %. Maternal Exam:  Uterine Assessment: Contraction strength is moderate.  Contraction duration is 60 seconds. Contraction frequency is regular.   Abdomen: Patient reports no abdominal tenderness. Fetal presentation: vertex  Introitus: Ferning test: positive.  Nitrazine test: not done. Amniotic fluid character: clear.  Cervix: Cervix evaluated by digital exam.     Fetal Exam Fetal Monitor Review: Mode: ultrasound.   Baseline rate: 135.  Variability: moderate (6-25 bpm).   Pattern: accelerations present and no decelerations.    Fetal State Assessment: Category I - tracings are normal.     Physical Exam  Nursing note and vitals reviewed. Constitutional: She is oriented to person, place, and time. She appears well-developed and well-nourished. No distress.  HENT:  Head: Normocephalic and atraumatic.  Cardiovascular: Normal rate.   Respiratory: Effort normal. No respiratory distress.  GI: Soft. There is no tenderness.  Musculoskeletal: She exhibits no edema.  Neurological: She is alert and oriented to person, place, and time.  Skin: Skin is warm and dry. No rash noted. She is not diaphoretic.  Psychiatric: She  has a normal mood and affect. Her behavior is normal.    Prenatal labs: ABO, Rh: A/POS/-- (09/04 1632) Antibody: NEG (09/04 1632) Rubella: 1.04 (09/04 1632) RPR: NON REAC (01/11 1518)  HBsAg: NEGATIVE (09/04 1632)  HIV: NONREACTIVE (01/11 1518)  GBS:  Negative (03/07 0000)   Assessment/Plan: Labor Plan #Labor: Progressing well at this point. Will monitor and augment if needed. Desires water birth. #Pain: Patient declines medication/epidural at this point #FWB: category 1 #ID: GBS negative     Beverely Lowdamo, Elena 01/06/2015, 3:44 AM i agree with above assessment

## 2015-01-06 NOTE — Plan of Care (Signed)
Problem: Consults Goal: Birthing Suites Patient Information Press F2 to bring up selections list  Outcome: Completed/Met Date Met:  01/06/15  Pt > [redacted] weeks EGA

## 2015-01-06 NOTE — Anesthesia Procedure Notes (Signed)

## 2015-01-07 LAB — CBC
HEMATOCRIT: 24.5 % — AB (ref 36.0–46.0)
Hemoglobin: 8.4 g/dL — ABNORMAL LOW (ref 12.0–15.0)
MCH: 30.4 pg (ref 26.0–34.0)
MCHC: 34.3 g/dL (ref 30.0–36.0)
MCV: 88.8 fL (ref 78.0–100.0)
Platelets: 155 10*3/uL (ref 150–400)
RBC: 2.76 MIL/uL — ABNORMAL LOW (ref 3.87–5.11)
RDW: 15.3 % (ref 11.5–15.5)
WBC: 16.9 10*3/uL — ABNORMAL HIGH (ref 4.0–10.5)

## 2015-01-07 NOTE — Lactation Note (Signed)
This note was copied from the chart of Catherine Eartha Inchrin Ziemba. Lactation Consultation Note  Patient Name: Catherine Moreno ZOXWR'UToday's Date: 01/07/2015 Reason for consult: Initial assessment   Initial consult at 2421 hours old.  GA 41.1; BW 7#14.1oz.  Mom is a P1 Infant has breastfed x10 (10-30 min +60 min) + 3 attempts (0-2 min) since birth 21 hours ago; voids-2; stools-4. Mom trying to get sleepy baby to feed.  LC assisted with teaching basic latching information and getting lip flanged on bottom with chin tug.  Mom has good technique.  Reviewed asymmetrical latching technique and sandwiching of breast.   Baby latched on left side with cross-cradle hold with rhythmic sucking and several swallows heard.  Baby fed for 15 min with LS-8.   Taught hand expression with return demonstration and observation of colostrum easily expressed.  Mom receptive to teaching and can return demonstrate tips taught with breastfeeding.   Educated on feeding with feeding cues, size of infant's stomach, and cluster feeding.  Educated of normal feeding behavior of 8-12 times per day. Mom has new DEBP at home for home use.  Has WIC.  Encouraged to continue exclusive breastfeeding.  No complaints of pain with feedings.   Lactation brochure given and informed of hospital support group and outpatient services. Encouraged to call for assistance as needed.    Maternal Data Formula Feeding for Exclusion: No Has patient been taught Hand Expression?: Yes (return demonstration with observation of colostrum) Does the patient have breastfeeding experience prior to this delivery?: No  Feeding Feeding Type: Breast Fed Length of feed: 15 min  LATCH Score/Interventions Latch: Grasps breast easily, tongue down, lips flanged, rhythmical sucking. Intervention(s): Breast compression  Audible Swallowing: A few with stimulation  Type of Nipple: Everted at rest and after stimulation  Comfort (Breast/Nipple): Soft / non-tender     Hold  (Positioning): Assistance needed to correctly position infant at breast and maintain latch. Intervention(s): Breastfeeding basics reviewed;Support Pillows;Position options;Skin to skin  LATCH Score: 8  Lactation Tools Discussed/Used WIC Program: Yes   Consult Status Consult Status: Follow-up Date: 01/08/15 Follow-up type: In-patient    Lendon KaVann, Herby Amick Walker 01/07/2015, 5:40 PM

## 2015-01-07 NOTE — Anesthesia Postprocedure Evaluation (Signed)
Anesthesia Post Note  Patient: Catherine Moreno  Procedure(s) Performed: * No procedures listed *  Anesthesia type: Epidural  Patient location: Mother/Baby  Post pain: Pain level controlled  Post assessment: Post-op Vital signs reviewed  Last Vitals:  Filed Vitals:   01/07/15 0245  BP: 130/70  Pulse: 85  Temp: 36.9 C  Resp: 18    Post vital signs: Reviewed  Level of consciousness: awake  Complications: No apparent anesthesia complications

## 2015-01-07 NOTE — Progress Notes (Signed)
Post Partum Day 1 Subjective:  Catherine Moreno is a 29 y.o. G1P1001 3065w1d s/p Spontaneous Vaginal Delivery.  No acute events overnight. Patient's main complaint this morning is exhaustion due to the events yesterday and being up since 4:00AM due to baby.  Pt denies problems with ambulating, voiding or po intake.  She denies nausea or vomiting.  Pain is well controlled.  She has had flatus. She has not had a bowel movement.  Lochia Small. Reports increased lochia after breast feeding.  Plan for birth control is Mirena.  Method of Feeding is breast. Patient had a baby boy but does not wish to have him circumcised at this time.   Objective: Blood pressure 130/70, pulse 85, temperature 98.4 F (36.9 C), temperature source Oral, resp. rate 18, height 5\' 9"  (1.753 m), weight 79.833 kg (176 lb), last menstrual period 03/24/2014, SpO2 99 %, unknown if currently breastfeeding.   Physical Exam:  General: alert, cooperative and no distress Lochia: normal flow Chest: CTAB without wheezing or rales Heart: RRR no m/r/g Abdomen: +BS, soft and non-tender to palpation.  Uterine Fundus: firm, midline. DVT Evaluation: No evidence of DVT seen on physical exam.   Extremities: Distal pulses 2+;  1+ edema up to mid-shins bilaterally.   Recent Labs  01/06/15 0400 01/07/15 0530  HGB 10.5* 8.4*  HCT 31.4* 24.5*    Assessment/Plan:  ASSESSMENT: Catherine Moreno is a 29 y.o. G1P1001 5365w1d s/p Spontaneous Vaginal Delivery on 01/06/2015.  Plan for discharge tomorrow Breastfeeding Contraception: Mirena Circumcision: No   LOS: 1 day   Wandra MannanManning, Brittany 01/07/2015, 7:10 AM   OB fellow attestation Post Partum Day 1 I have seen and examined this patient and agree with above documentation in the student's note.   Catherine Moreno is a 29 y.o. G1P1001 s/p NSVD.  Pt denies problems with ambulating, voiding or po intake. Pain is well controlled.  Plan for birth control is IUD.  Method of Feeding: breast  PE:  BP 130/70  mmHg  Pulse 85  Temp(Src) 98.4 F (36.9 C) (Oral)  Resp 18  Ht 5\' 9"  (1.753 m)  Wt 176 lb (79.833 kg)  BMI 25.98 kg/m2  SpO2 99%  LMP 03/24/2014  Breastfeeding? Unknown Fundus firm  Plan for discharge: PPD2  William DaltonMcEachern, Duward Allbritton, MD 8:19 AM

## 2015-01-08 ENCOUNTER — Ambulatory Visit: Payer: Self-pay

## 2015-01-08 MED ORDER — IBUPROFEN 600 MG PO TABS: 600.0000 mg | ORAL_TABLET | Freq: Four times a day (QID) | ORAL | Status: AC

## 2015-01-08 MED ORDER — OXYCODONE-ACETAMINOPHEN 5-325 MG PO TABS: 1.0000 | ORAL_TABLET | Freq: Four times a day (QID) | ORAL | Status: DC | PRN

## 2015-01-08 MED ORDER — OXYCODONE-ACETAMINOPHEN 5-325 MG PO TABS
1.0000 | ORAL_TABLET | Freq: Four times a day (QID) | ORAL | Status: DC | PRN
  Administered 2015-01-08: 1 via ORAL
  Filled 2015-01-08: qty 1

## 2015-01-08 NOTE — Lactation Note (Signed)
This note was copied from the chart of Catherine Eartha Inchrin Sawyers. Lactation Consultation Note  Patient Name: Catherine Moreno Reason for consult: Follow-up assessment Assisted Mom with positioning baby. Mom is concerned about fractured left clavicle. Suggestions given for positioning to keep baby comfortable with BF. Advised baby should be at the breast 8-12 times in 24 hours and with feeding ques. Cluster feeding reviewed. Engorgement care reviewed if needed. Advised of OP services and support group. Encouraged to call for questions/concerns.   Maternal Data    Feeding Feeding Type: Breast Fed  LATCH Score/Interventions Latch: Repeated attempts needed to sustain latch, nipple held in mouth throughout feeding, stimulation needed to elicit sucking reflex. Intervention(s): Adjust position;Assist with latch;Breast massage;Breast compression  Audible Swallowing: A few with stimulation  Type of Nipple: Everted at rest and after stimulation  Comfort (Breast/Nipple): Soft / non-tender     Hold (Positioning): Assistance needed to correctly position infant at breast and maintain latch. Intervention(s): Breastfeeding basics reviewed;Support Pillows;Position options;Skin to skin  LATCH Score: 7  Lactation Tools Discussed/Used     Consult Status Consult Status: Complete Date: 01/08/15 Follow-up type: In-patient    Catherine Moreno, Catherine Moreno Moreno, 4:19 PM

## 2015-01-08 NOTE — Discharge Summary (Signed)
Obstetric Discharge Summary Reason for Admission: onset of labor Prenatal Procedures: ultrasound Intrapartum Procedures: spontaneous vaginal delivery Postpartum Procedures: none Complications-Operative and Postpartum: 1st degree perineal laceration  Delivery Note At 8:06 PM a viable female was delivered via Vaginal, Spontaneous Delivery (Presentation: Left Occiput Anterior). APGAR: 6, 8; weight pending .  Placenta status: Intact, Spontaneous. Cord: 3 vessels with the following complications: None. Cord pH: n/a  Anesthesia: Epidural  Episiotomy: None Lacerations: 1st degree perineal Suture Repair: 3.0 vicryl rapide Est. Blood Loss (mL): 350  Mom to postpartum. Baby to Couplet care / Skin to Skin.  Hospital Course:  Active Problems:   PROM (premature rupture of membranes)   Catherine Moreno is a 29 y.o. G1P1001 s/p NSVD.  Patient was admitted labor.  She had a postpartum course that was uncomplicated including no problems with ambulating, PO intake, urination, pain, or bleeding. The patient feels ready to go home and will be discharged with outpatient follow-up.   Today: No acute events overnight.  Pt denies problems with ambulating, voiding or po intake.  She denies nausea or vomiting.  Pain is well controlled.  She has had flatus. She has not had bowel movement.  Lochia Moderate.  Plan for birth control is IUD.  Method of Feeding: Breast  Physical Exam:  General: alert, cooperative, appears stated age and no distress  Cardio: RRR, no murmurs, +2 DP Pulm: CTAB, no increased WOB Lochia: appropriate Uterine Fundus: firm DVT Evaluation: No evidence of DVT seen on physical exam. Negative Homan's sign. No cords or calf tenderness. Mild b/l non-pitting LE edema  H/H: Lab Results  Component Value Date/Time   HGB 8.4* 01/07/2015 05:30 AM   HCT 24.5* 01/07/2015 05:30 AM    Discharge Diagnoses: Term Pregnancy-delivered  Discharge Information: Date: 01/08/2015 Activity: pelvic  rest Diet: routine  Medications: PNV, Ibuprofen and Percocet Breast feeding:  Yes Condition: stable Instructions: refer to handout Discharge to: home      Medication List    STOP taking these medications        diphenhydrAMINE 25 MG tablet  Commonly known as:  BENADRYL     ondansetron 8 MG tablet  Commonly known as:  ZOFRAN     promethazine 25 MG tablet  Commonly known as:  PHENERGAN      TAKE these medications        acetaminophen 500 MG tablet  Commonly known as:  TYLENOL  Take 1,000 mg by mouth every 6 (six) hours as needed for headache.     Breast Pump Misc  Dispense one breast pump for patient     Butalbital-APAP-Caffeine 50-325-40 MG per capsule  Take 1-2 capsules by mouth every 6 (six) hours as needed for headache. Do exceed 8 tablets in 24 hours.     fluticasone 50 MCG/ACT nasal spray  Commonly known as:  FLONASE  Place 2 sprays into both nostrils daily.     GAS-X PO  Take 2 tablets by mouth daily as needed (flatulence).     ibuprofen 600 MG tablet  Commonly known as:  ADVIL,MOTRIN  Take 1 tablet (600 mg total) by mouth every 6 (six) hours.     oxyCODONE-acetaminophen 5-325 MG per tablet  Commonly known as:  PERCOCET/ROXICET  Take 1-2 tablets by mouth every 6 (six) hours as needed for moderate pain or severe pain.       Follow-up Information    [facility] [place]. Schedule an appointment as soon as possible for a visit  in 6 weeks.   Specialty:  Obstetrics and Gynecology   Why:  6 week postpartum visit   Contact information:   [address] [place] [address] [phone]      Raliegh Ip, DO [facility], PGY-1 01/08/2015,7:56 AM  CNM attestation:  I have seen and examined this patient; I agree with above documentation in the resident's note.   Catherine Moreno is a 29 y.o. G1P1001 on PPD#2 following NSVD.  She is doing well, pain well controlled with  Motrin, ambulating, with no problems voiding.   PE: BP 136/83 mmHg  Pulse 79  Temp(Src) 98.6 F (37 C) (Oral)  Resp 16  Ht  (1.753 m)  Wt 79.833 kg (176 lb)  BMI 25.98 kg/m2  SpO2 99%  LMP 03/24/2014  Breastfeeding? Unknown Gen: calm comfortable, NAD Resp: normal effort, no distress Fundus firm Lochia wnl  ROS, labs, PMH reviewed  Plan: D/C home Breastfeeding Mirena for contraception  LEFTWICH-KIRBY, Assunta Pupo, CNM 8:00 AM

## 2015-01-08 NOTE — Discharge Instructions (Signed)

## 2015-01-13 ENCOUNTER — Ambulatory Visit (INDEPENDENT_AMBULATORY_CARE_PROVIDER_SITE_OTHER): Payer: BLUE CROSS/BLUE SHIELD | Admitting: Obstetrics & Gynecology

## 2015-01-13 ENCOUNTER — Encounter: Payer: Self-pay | Admitting: Obstetrics & Gynecology

## 2015-01-13 VITALS — BP 140/91 | HR 109 | Resp 16 | Ht 67.0 in | Wt 147.0 lb

## 2015-01-13 DIAGNOSIS — IMO0001 Reserved for inherently not codable concepts without codable children: Secondary | ICD-10-CM

## 2015-01-13 DIAGNOSIS — O85 Puerperal sepsis: Secondary | ICD-10-CM

## 2015-01-13 DIAGNOSIS — R030 Elevated blood-pressure reading, without diagnosis of hypertension: Secondary | ICD-10-CM

## 2015-01-13 MED ORDER — AMOXICILLIN-POT CLAVULANATE 875-125 MG PO TABS: 1.0000 | ORAL_TABLET | Freq: Two times a day (BID) | ORAL | Status: DC

## 2015-01-13 NOTE — Progress Notes (Signed)
   Subjective:    Patient ID: Catherine Moreno, female    DOB: 03/17/1986, 29 y.o.   MRN: [id number]  HPI  Patient wants to make sure that vagina is "ok" after delivery.  She thinks she sees tissue. No bleeding.  Pain with urination (when urine hits perineum).  Still having some mild lochia   Review of Systems  Constitutional: Negative.   Cardiovascular: Negative.   Gastrointestinal: Negative.   Genitourinary: Positive for vaginal bleeding. Negative for vaginal pain and pelvic pain.       Objective:   Physical Exam  Constitutional: She appears well-developed and well-nourished. No distress.  HENT:  Head: Normocephalic and atraumatic.  Abdominal: Soft.  Genitourinary: No vaginal discharge found.  ? Early infection of perineal lacerations.  Right periurethral and left labial.    Skin: Skin is warm and dry.  Psychiatric: She has a normal mood and affect.  Vitals reviewed.         Assessment & Plan:  29 yo g1P1001 s/p NSVD with perineal infection  1-Augmentin x 10 days 2-RTC if not better or develops fever/chills/worsening 3-BP 140/90--no eduma or symptoms of preeclampsia.  Labs sent Manual rpt was 130/90.  Preeclampsia precautions given.

## 2015-01-14 LAB — COMPREHENSIVE METABOLIC PANEL
ALT: 18 U/L (ref 0–35)
AST: 22 U/L (ref 0–37)
Albumin: 3.3 g/dL — ABNORMAL LOW (ref 3.5–5.2)
Alkaline Phosphatase: 96 U/L (ref 39–117)
BUN: 15 mg/dL (ref 6–23)
CALCIUM: 8.3 mg/dL — AB (ref 8.4–10.5)
CHLORIDE: 107 meq/L (ref 96–112)
CO2: 22 mEq/L (ref 19–32)
Creat: 0.56 mg/dL (ref 0.50–1.10)
Glucose, Bld: 79 mg/dL (ref 70–99)
Potassium: 4.1 mEq/L (ref 3.5–5.3)
Sodium: 137 mEq/L (ref 135–145)
TOTAL PROTEIN: 6.5 g/dL (ref 6.0–8.3)
Total Bilirubin: 0.4 mg/dL (ref 0.2–1.2)

## 2015-01-14 LAB — CBC
HEMATOCRIT: 31.3 % — AB (ref 36.0–46.0)
Hemoglobin: 10.1 g/dL — ABNORMAL LOW (ref 12.0–15.0)
MCH: 29.4 pg (ref 26.0–34.0)
MCHC: 32.3 g/dL (ref 30.0–36.0)
MCV: 91 fL (ref 78.0–100.0)
MPV: 10.3 fL (ref 8.6–12.4)
Platelets: 373 10*3/uL (ref 150–400)
RBC: 3.44 MIL/uL — ABNORMAL LOW (ref 3.87–5.11)
RDW: 15.8 % — ABNORMAL HIGH (ref 11.5–15.5)
WBC: 9.7 10*3/uL (ref 4.0–10.5)

## 2015-01-14 LAB — PROTEIN / CREATININE RATIO, URINE
Creatinine, Urine: 106.3 mg/dL
Protein Creatinine Ratio: 1.82 — ABNORMAL HIGH (ref <0.15)
TOTAL PROTEIN, URINE: 193 mg/dL — AB (ref 5–24)

## 2015-01-15 LAB — CULTURE, URINE COMPREHENSIVE
Colony Count: NO GROWTH
Organism ID, Bacteria: NO GROWTH

## 2015-01-16 ENCOUNTER — Encounter: Payer: Self-pay | Admitting: *Deleted

## 2015-01-16 ENCOUNTER — Telehealth: Payer: Self-pay | Admitting: *Deleted

## 2015-01-16 NOTE — Telephone Encounter (Signed)
Pt notified of labwork and is schedule for 01/20/15 to have BP rechecked per Dr Penne LashLeggett

## 2015-01-16 NOTE — Telephone Encounter (Signed)
-----   Message from Catherine DukesKelly H Leggett, MD sent at 01/15/2015  7:13 PM EDT ----- Pt's protein creatinine ratio is high.  Rest of labs are nml.  Please have pateint come in for BP check.

## 2015-01-20 ENCOUNTER — Ambulatory Visit (INDEPENDENT_AMBULATORY_CARE_PROVIDER_SITE_OTHER): Payer: BLUE CROSS/BLUE SHIELD | Admitting: Obstetrics & Gynecology

## 2015-01-20 ENCOUNTER — Encounter: Payer: Self-pay | Admitting: Obstetrics & Gynecology

## 2015-01-20 VITALS — BP 110/69 | HR 94 | Resp 16 | Ht 67.0 in | Wt 138.0 lb

## 2015-01-20 DIAGNOSIS — O1490 Unspecified pre-eclampsia, unspecified trimester: Secondary | ICD-10-CM

## 2015-01-20 NOTE — Progress Notes (Signed)
   Subjective:    Patient ID: Catherine Moreno, female    DOB: 01/03/1986, 29 y.o.   MRN: [id number]  HPI  Pt presents for BP check   No HA, scotomata, RUQ pain.   No selling.  Breastfeeding well.   Review of Systems    as above Objective:   Physical Exam  Constitutional: She is oriented to person, place, and time. She appears well-developed and well-nourished. No distress.  HENT:  Head: Normocephalic and atraumatic.  Eyes: Conjunctivae are normal.  Pulmonary/Chest: Effort normal.  Abdominal: Soft. Bowel sounds are normal. She exhibits no distension and no mass. There is no tenderness. There is no rebound and no guarding.  Musculoskeletal: She exhibits no edema.  Neurological: She is alert and oriented to person, place, and time.  Skin: Skin is warm and dry.  Psychiatric: She has a normal mood and affect.  Vitals reviewed.   Filed Vitals:   01/20/15 1421  BP: 110/69  Pulse: 94  Resp: 16  Height: 5\' 7"  (1.702 m)  Weight: 138 lb (62.596 kg)         Assessment & Plan:  29 yo female with nml BP.  Preeclampsia resolved. Mirena pp.

## 2015-02-17 ENCOUNTER — Encounter: Payer: Self-pay | Admitting: Advanced Practice Midwife

## 2015-02-17 ENCOUNTER — Ambulatory Visit (INDEPENDENT_AMBULATORY_CARE_PROVIDER_SITE_OTHER): Payer: BLUE CROSS/BLUE SHIELD | Admitting: Advanced Practice Midwife

## 2015-02-17 VITALS — BP 105/68 | HR 77 | Resp 16 | Ht 67.0 in | Wt 137.0 lb

## 2015-02-17 DIAGNOSIS — Z01812 Encounter for preprocedural laboratory examination: Secondary | ICD-10-CM

## 2015-02-17 DIAGNOSIS — Z30430 Encounter for insertion of intrauterine contraceptive device: Secondary | ICD-10-CM

## 2015-02-17 DIAGNOSIS — Z975 Presence of (intrauterine) contraceptive device: Secondary | ICD-10-CM

## 2015-02-17 LAB — POCT URINE PREGNANCY: Preg Test, Ur: NEGATIVE

## 2015-02-17 MED ORDER — LEVONORGESTREL 18.6 MCG/DAY IU IUD
1.0000 | INTRAUTERINE_SYSTEM | Freq: Once | INTRAUTERINE | Status: AC
  Administered 2015-02-17: 1 via INTRAUTERINE

## 2015-02-17 NOTE — Progress Notes (Signed)
Patient ID: Catherine Moreno, female   DOB: 02/05/1986, 29 y.o.   MRN: [id number] Post Partum Exam  Catherine Moreno is a 29 y.o. female  who presents for a postpartum visit. She is 6 weeks  postpartum following a vaginal delivery. I have fully reviewed the prenatal and intrapartum course. The delivery was at 41w 1d gestational weeks. Outcome: Baby boy weighing 7lb 14 oz without circumcision. Anesthesia: epidural Postpartum course has been unremarkable. Baby's course has been unremarkable Baby is feeding by breast. Bleeding has stopped. Bowel function is norma Bladder function is normal. Patient has not been sexually active. Contraception method is discussion of IUD. Postpartum depression screening: negative  The following portions of the patient's history were reviewed and updated as appropriate: allergies, current medications, past family history, past medical history, past social history, past surgical history and problem list.  Review of Systems Pertinent items are noted in HPI.   Objective:    BP 116/78 mmHg  Pulse 78  Resp 16  Ht 5\' 5"  (1.651 m)  Wt 211 lb (95.709 kg)  BMI 35.11 kg/m2  Breastfeeding? Yes  General:  alert, cooperative and no distress   Breasts:  inspection negative, no nipple discharge or bleeding, no masses or nodularity palpable  Lungs: clear to auscultation bilaterally  Heart:  regular rate and rhythm, S1, S2 normal, no murmur, click, rub or gallop  Abdomen: soft, non-tender; bowel sounds normal; no masses,  no organomegaly   Vulva:  normal  Vagina: normal vagina, no discharge, exudate, lesion, or erythema  Cervix:  multiparous appearance  Corpus: normal size, contour, position, consistency, mobility, non-tender  Adnexa:  normal adnexa  Rectal Exam: Not performed.        Patient identified, informed consent performed, signed copy in chart, time out was performed.  Urine pregnancy test negative.  Speculum placed in the vagina.  Cervix visualized.  Cleaned with Betadine x 2.   Grasped anteriourly with a single tooth tenaculum.  Uterus sounded to 5cm.  Mirena IUD placed per manufacturer's recommendations. When introducer was removed IUD came out with it.  Reinserted but same thing happened. Mirena will be sent back to manufacturer.   Lilletta IUD inserted instead with no problem.  Strings trimmed to 3 cm.   Patient given post procedure instructions and Lilletta care card with expiration date.  Patient is asked to check IUD strings periodically and follow up in 4-6 weeks for IUD check.  Assessment:    Normal postpartum exam. Pap smear not done at today's visit.   Plan:    1. Contraception: IUD 2. Discussed accepted duration of Lilletta IUD is 3 years but may be extended to 5 yrs in future.  Motrin for cramping 3. Follow up in: 1 month for string check  or as needed.

## 2015-02-19 ENCOUNTER — Encounter: Payer: Self-pay | Admitting: Advanced Practice Midwife

## 2015-02-19 DIAGNOSIS — Z30430 Encounter for insertion of intrauterine contraceptive device: Secondary | ICD-10-CM | POA: Insufficient documentation

## 2015-02-19 NOTE — Patient Instructions (Signed)
Levonorgestrel intrauterine device (IUD) What is this medicine? LEVONORGESTREL IUD (LEE voe nor jes trel) is a contraceptive (birth control) device. The device is placed inside the uterus by a healthcare professional. It is used to prevent pregnancy and can also be used to treat heavy bleeding that occurs during your period. Depending on the device, it can be used for 3 to 5 years. This medicine may be used for other purposes; ask your health care provider or pharmacist if you have questions. COMMON BRAND NAME(S): LILETTA, Mirena, Skyla What should I tell my health care provider before I take this medicine? They need to know if you have any of these conditions: -abnormal Pap smear -cancer of the breast, uterus, or cervix -diabetes -endometritis -genital or pelvic infection now or in the past -have more than one sexual partner or your partner has more than one partner -heart disease -history of an ectopic or tubal pregnancy -immune system problems -IUD in place -liver disease or tumor -problems with blood clots or take blood-thinners -use intravenous drugs -uterus of unusual shape -vaginal bleeding that has not been explained -an unusual or allergic reaction to levonorgestrel, other hormones, silicone, or polyethylene, medicines, foods, dyes, or preservatives -pregnant or trying to get pregnant -breast-feeding How should I use this medicine? This device is placed inside the uterus by a health care professional. Talk to your pediatrician regarding the use of this medicine in children. Special care may be needed. Overdosage: If you think you have taken too much of this medicine contact a poison control center or emergency room at once. NOTE: This medicine is only for you. Do not share this medicine with others. What if I miss a dose? This does not apply. What may interact with this medicine? Do not take this medicine with any of the following  medications: -amprenavir -bosentan -fosamprenavir This medicine may also interact with the following medications: -aprepitant -barbiturate medicines for inducing sleep or treating seizures -bexarotene -griseofulvin -medicines to treat seizures like carbamazepine, ethotoin, felbamate, oxcarbazepine, phenytoin, topiramate -modafinil -pioglitazone -rifabutin -rifampin -rifapentine -some medicines to treat HIV infection like atazanavir, indinavir, lopinavir, nelfinavir, tipranavir, ritonavir -St. John's wort -warfarin This list may not describe all possible interactions. Give your health care provider a list of all the medicines, herbs, non-prescription drugs, or dietary supplements you use. Also tell them if you smoke, drink alcohol, or use illegal drugs. Some items may interact with your medicine. What should I watch for while using this medicine? Visit your doctor or health care professional for regular check ups. See your doctor if you or your partner has sexual contact with others, becomes HIV positive, or gets a sexual transmitted disease. This product does not protect you against HIV infection (AIDS) or other sexually transmitted diseases. You can check the placement of the IUD yourself by reaching up to the top of your vagina with clean fingers to feel the threads. Do not pull on the threads. It is a good habit to check placement after each menstrual period. Call your doctor right away if you feel more of the IUD than just the threads or if you cannot feel the threads at all. The IUD may come out by itself. You may become pregnant if the device comes out. If you notice that the IUD has come out use a backup birth control method like condoms and call your health care provider. Using tampons will not change the position of the IUD and are okay to use during your period. What side effects may   I notice from receiving this medicine? Side effects that you should report to your doctor or  health care professional as soon as possible: -allergic reactions like skin rash, itching or hives, swelling of the face, lips, or tongue -fever, flu-like symptoms -genital sores -high blood pressure -no menstrual period for 6 weeks during use -pain, swelling, warmth in the leg -pelvic pain or tenderness -severe or sudden headache -signs of pregnancy -stomach cramping -sudden shortness of breath -trouble with balance, talking, or walking -unusual vaginal bleeding, discharge -yellowing of the eyes or skin Side effects that usually do not require medical attention (report to your doctor or health care professional if they continue or are bothersome): -acne -breast pain -change in sex drive or performance -changes in weight -cramping, dizziness, or faintness while the device is being inserted -headache -irregular menstrual bleeding within first 3 to 6 months of use -nausea This list may not describe all possible side effects. Call your doctor for medical advice about side effects. You may report side effects to FDA at 1-800-FDA-1088. Where should I keep my medicine? This does not apply. NOTE: This sheet is a summary. It may not cover all possible information. If you have questions about this medicine, talk to your doctor, pharmacist, or health care provider.  2015, [employer]vier/Gold Standard. (2011-10-14 13:54:04)  

## 2015-03-24 ENCOUNTER — Encounter: Payer: Self-pay | Admitting: Advanced Practice Midwife

## 2015-03-24 ENCOUNTER — Ambulatory Visit (INDEPENDENT_AMBULATORY_CARE_PROVIDER_SITE_OTHER): Payer: BLUE CROSS/BLUE SHIELD | Admitting: Advanced Practice Midwife

## 2015-03-24 VITALS — BP 104/70 | HR 86 | Resp 16 | Ht 67.0 in | Wt 132.0 lb

## 2015-03-24 DIAGNOSIS — N941 Dyspareunia: Secondary | ICD-10-CM

## 2015-03-24 DIAGNOSIS — N739 Female pelvic inflammatory disease, unspecified: Secondary | ICD-10-CM

## 2015-03-24 DIAGNOSIS — T8384XA Pain from genitourinary prosthetic devices, implants and grafts, initial encounter: Secondary | ICD-10-CM | POA: Diagnosis not present

## 2015-03-24 MED ORDER — DOXYCYCLINE HYCLATE 100 MG PO CAPS: 100.0000 mg | ORAL_CAPSULE | Freq: Two times a day (BID) | ORAL | Status: DC

## 2015-03-24 MED ORDER — CEFTRIAXONE SODIUM 1 G IJ SOLR: 250.0000 mg | INTRAMUSCULAR | Status: DC

## 2015-03-24 MED ORDER — CEFTRIAXONE SODIUM 1 G IJ SOLR
250.0000 mg | Freq: Once | INTRAMUSCULAR | Status: AC
  Administered 2015-03-24: 250 mg via INTRAMUSCULAR

## 2015-03-24 MED ORDER — CEFTRIAXONE SODIUM 250 MG IJ SOLR: 250.0000 mg | Freq: Once | INTRAMUSCULAR | Status: DC

## 2015-03-24 MED ORDER — AZITHROMYCIN 500 MG PO TABS: 1000.0000 mg | ORAL_TABLET | Freq: Once | ORAL | Status: AC

## 2015-03-24 NOTE — Patient Instructions (Signed)
Pelvic Inflammatory Disease °Pelvic inflammatory disease (PID) refers to an infection in some or all of the female organs. The infection can be in the uterus, ovaries, fallopian tubes, or the surrounding tissues in the pelvis. PID can cause abdominal or pelvic pain that comes on suddenly (acute pelvic pain). PID is a serious infection because it can lead to lasting (chronic) pelvic pain or the inability to have children (infertile).  °CAUSES  °The infection is often caused by the normal bacteria found in the vaginal tissues. PID may also be caused by an infection that is spread during sexual contact. PID can also occur following:  °· The birth of a baby.   °· A miscarriage.   °· An abortion.   °· Major pelvic surgery.   °· The use of an intrauterine device (IUD).   °· A sexual assault.   °RISK FACTORS °Certain factors can put a person at higher risk for PID, such as: °· Being younger than 25 years. °· Being sexually active at a young age. °· Using nonbarrier contraception. °· Having multiple sexual partners. °· Having sex with someone who has symptoms of a genital infection. °· Using oral contraception. °Other times, certain behaviors can increase the possibility of getting PID, such as: °· Having sex during your period. °· Using a vaginal douche. °· Having an intrauterine device (IUD) in place. °SYMPTOMS  °· Abdominal or pelvic pain.   °· Fever.   °· Chills.   °· Abnormal vaginal discharge. °· Abnormal uterine bleeding.   °· Unusual pain shortly after finishing your period. °DIAGNOSIS  °Your caregiver will choose some of the following methods to make a diagnosis, such as:  °· Performing a physical exam and history. A pelvic exam typically reveals a very tender uterus and surrounding pelvis.   °· Ordering laboratory tests including a pregnancy test, blood tests, and urine test.  °· Ordering cultures of the vagina and cervix to check for a sexually transmitted infection (STI). °· Performing an ultrasound.    °· Performing a laparoscopic procedure to look inside the pelvis.   °TREATMENT  °· Antibiotic medicines may be prescribed and taken by mouth.   °· Sexual partners may be treated when the infection is caused by a sexually transmitted disease (STD).   °· Hospitalization may be needed to give antibiotics intravenously. °· Surgery may be needed, but this is rare. °It may take weeks until you are completely well. If you are diagnosed with PID, you should also be checked for human immunodeficiency virus (HIV).   °HOME CARE INSTRUCTIONS  °· If given, take your antibiotics as directed. Finish the medicine even if you start to feel better.   °· Only take over-the-counter or prescription medicines for pain, discomfort, or fever as directed by your caregiver.   °· Do not have sexual intercourse until treatment is completed or as directed by your caregiver. If PID is confirmed, your recent sexual partner(s) will need treatment.   °· Keep your follow-up appointments. °SEEK MEDICAL CARE IF:  °· You have increased or abnormal vaginal discharge.   °· You need prescription medicine for your pain.   °· You vomit.   °· You cannot take your medicines.   °· Your partner has an STD.   °SEEK IMMEDIATE MEDICAL CARE IF:  °· You have a fever.   °· You have increased abdominal or pelvic pain.   °· You have chills.   °· You have pain when you urinate.   °· You are not better after 72 hours following treatment.   °MAKE SURE YOU:  °· Understand these instructions. °· Will watch your condition. °· Will get help right away if you are not doing well or get worse. °  Document Released: 09/13/2005 Document Revised: 01/08/2013 Document Reviewed: 09/09/2011 °Exit[employer]® Patient Information ©2015 [employer]. This information is not intended to replace advice given to you by your health care provider. Make sure you discuss any questions you have with your health care provider. ° °

## 2015-03-24 NOTE — Progress Notes (Signed)
Subjective:     Patient ID: Catherine Moreno, female   DOB: 08/10/1986, 29 y.o.   MRN: [id number]  HPI: Here for string check Mirena IUD placed 02/21/15. C/O pain w/ IC. Pt unable to tolerate penetration. Did not have IC after delivery and before IUD placement. Describes pain as dull, only occuring w/ attempting IC. Also of note, pt's spouse had to go to ED for acute testicular pain 2 days ago. Being Tx'd for possible infection. Rx Doxy. She states she is in a mutually monogamous relationship.  Review of Systems Neg for fever, chills, vaginal discharge, abd pain. Pos for dyspareunia.      Objective:   Physical Exam.  BP 104/70 mmHg  Pulse 86  Resp 16  Ht 5\' 7"  (1.702 m)  Wt 132 lb (59.875 kg)  BMI 20.67 kg/m2  Breastfeeding? Yes Physical Examination: General appearance - alert, well appearing, and in no distress and normal appearing weight Abdomen - soft, nontender, nondistended, no masses or organomegaly no rebound tenderness noted Pelvic - normal external genitalia, vulva, vagina, cervix, uterus and adnexa,   VULVA: normal appearing vulva with no masses, tenderness or lesions, perineum well-healed, NT   VAGINA: normal appearing vagina with normal color and discharge, no lesions, vaginal discharge - odorless, scant and thin  CERVIX: normal appearing cervix without discharge or lesions, no discharge noted, cervical motion tenderness present, multiparous os, strings seen. IUD NOT protruding through os.   UTERUS: uterus is normal size, shape, consistency and nontender, retroverted  ADNEXA: normal adnexa in size, nontender and no masses  US: IUD properly positioned.      Assessment:     Possible PID--most likely from vaginal flora due to onset <1 month after IUD placement. IUD in correct position.     Plan:     Rocephin and Azithromycin per Dr. Debroah Moreno.  No IC until ABX completed GC/Chlamydia, Wet Prep  To MAU for fever, worsening pain CBC w/ dif drawn    Catherine Moreno,  [place] 03/24/2015 12:23 PM

## 2015-03-24 NOTE — Addendum Note (Signed)
Addended by: Anell BarrHOWARD, JENNIFER L on: 03/24/2015 02:27 PM   Modules accepted: Orders, Medications

## 2015-03-25 ENCOUNTER — Telehealth: Payer: Self-pay | Admitting: *Deleted

## 2015-03-25 LAB — CBC WITH DIFFERENTIAL/PLATELET
BASOS PCT: 1 % (ref 0–1)
Basophils Absolute: 0.1 10*3/uL (ref 0.0–0.1)
Eosinophils Absolute: 0.2 10*3/uL (ref 0.0–0.7)
Eosinophils Relative: 3 % (ref 0–5)
HEMATOCRIT: 40 % (ref 36.0–46.0)
Hemoglobin: 13.1 g/dL (ref 12.0–15.0)
LYMPHS ABS: 1.8 10*3/uL (ref 0.7–4.0)
Lymphocytes Relative: 32 % (ref 12–46)
MCH: 29.2 pg (ref 26.0–34.0)
MCHC: 32.8 g/dL (ref 30.0–36.0)
MCV: 89.1 fL (ref 78.0–100.0)
MONOS PCT: 9 % (ref 3–12)
MPV: 11 fL (ref 8.6–12.4)
Monocytes Absolute: 0.5 10*3/uL (ref 0.1–1.0)
NEUTROS ABS: 3.1 10*3/uL (ref 1.7–7.7)
NEUTROS PCT: 55 % (ref 43–77)
Platelets: 282 10*3/uL (ref 150–400)
RBC: 4.49 MIL/uL (ref 3.87–5.11)
RDW: 15.2 % (ref 11.5–15.5)
WBC: 5.7 10*3/uL (ref 4.0–10.5)

## 2015-03-25 NOTE — Telephone Encounter (Signed)
Pt notified of lab results and instructions given per Ivonne AndrewV Smith, CNM

## 2015-03-25 NOTE — Telephone Encounter (Signed)
-----   Message from [place], [place] sent at 03/25/2015  1:09 PM EDT ----- Normal CBC. Continue ABX. No IC until ABX completed. F/U if no improvement after ABX completed.

## 2015-03-26 LAB — GC/CHLAMYDIA PROBE AMP
CT Probe RNA: NEGATIVE
GC Probe RNA: NEGATIVE

## 2016-10-03 IMAGING — US US OB COMP +14 WK
2 series · 12 of 28 positions shown · non-contrast
Comparison: none

[Series 1: us ob comp +14 wk mfm · 11 of 78 slices shown (1 of 2)]
[im 4/78]
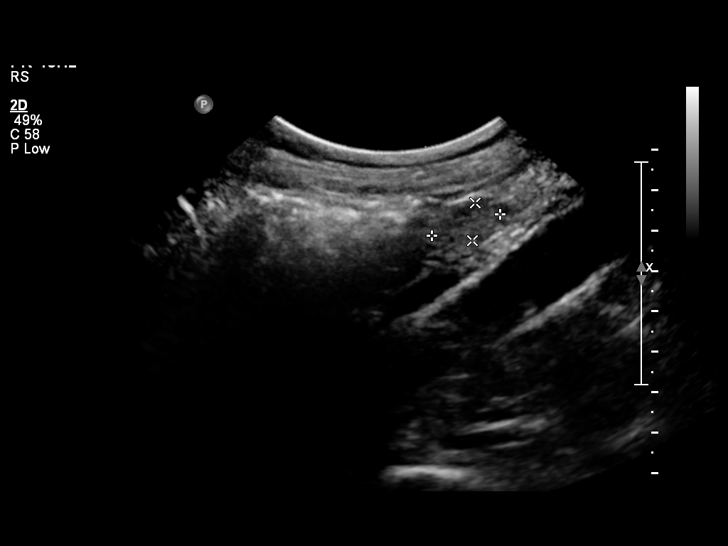
[im 10/78]
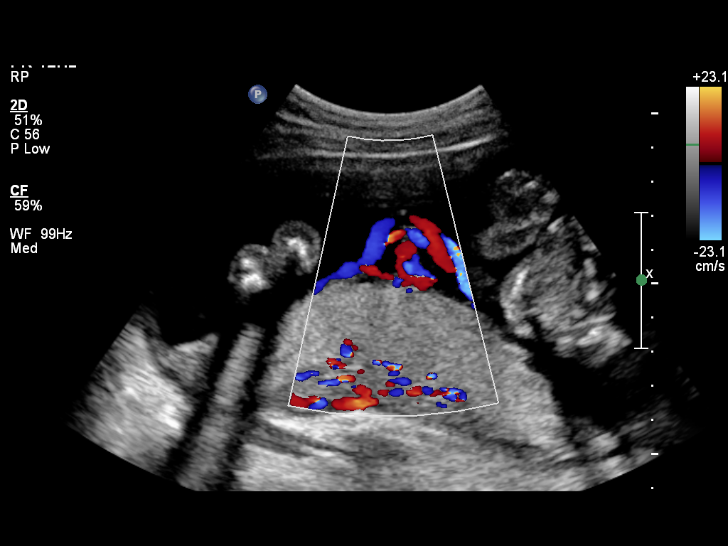
[im 16/78]
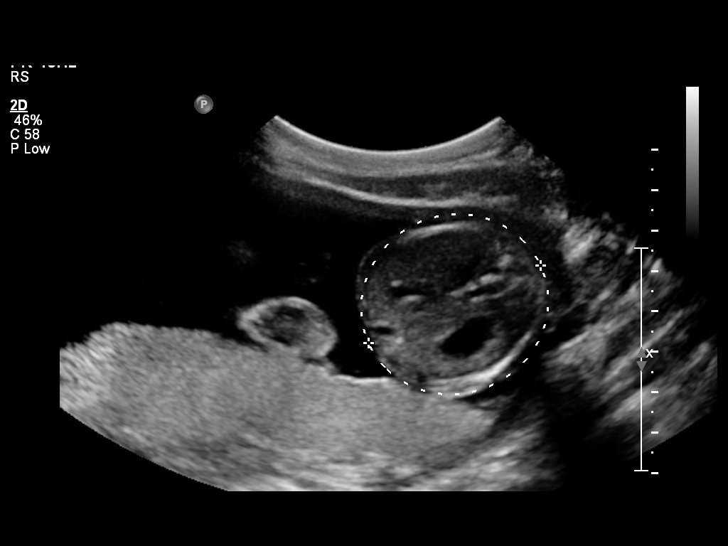
[im 25/78]
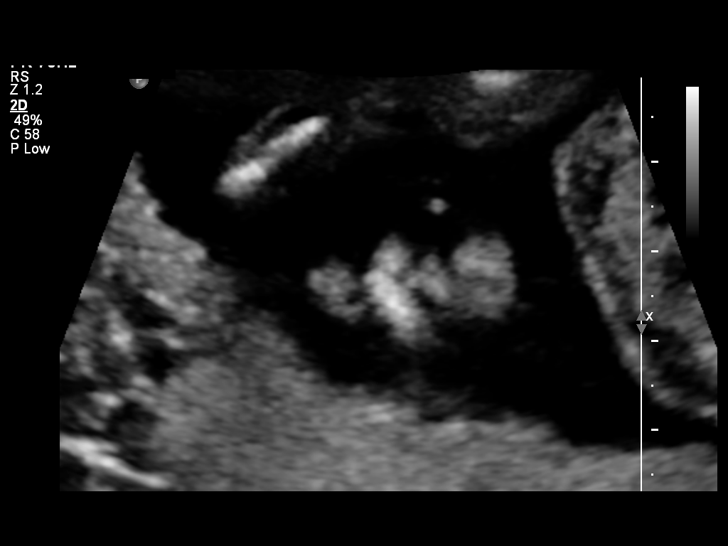
[im 31/78]
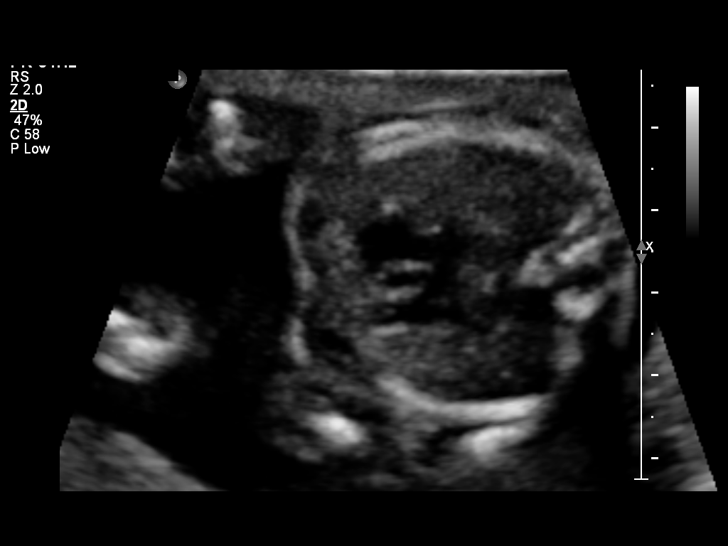
[im 37/78]
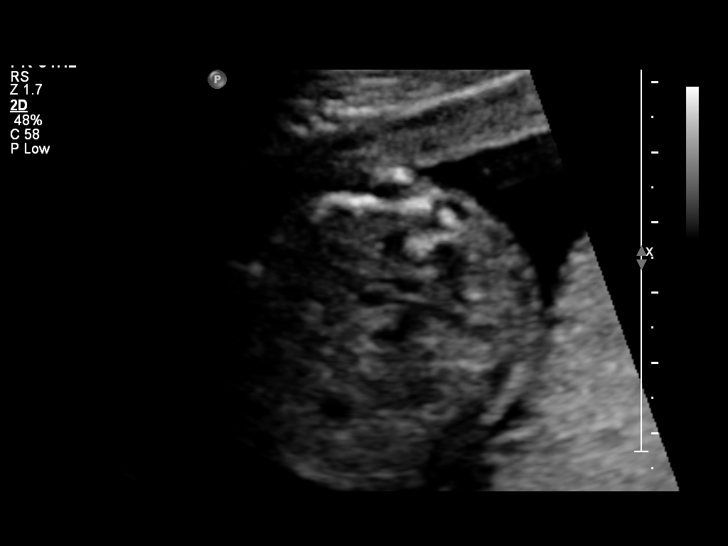
[im 47/78]
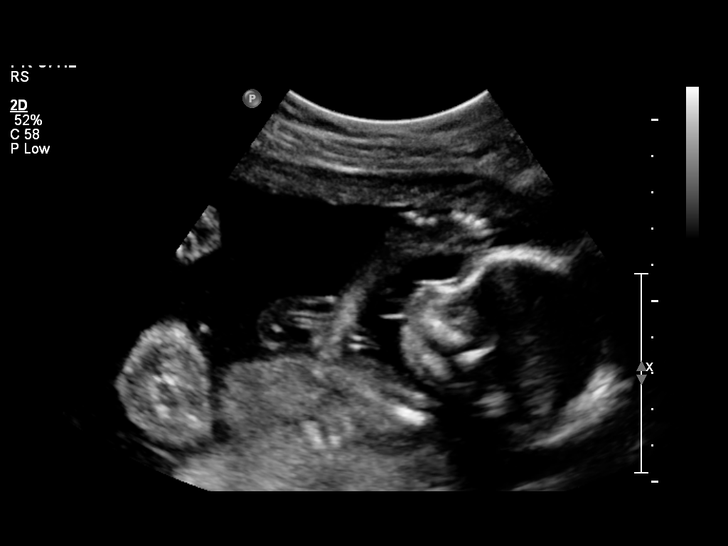
[im 53/78]
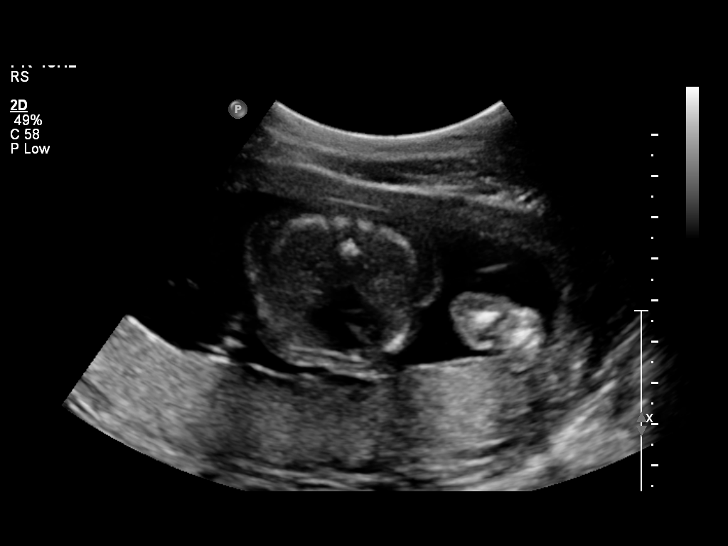
[im 59/78]
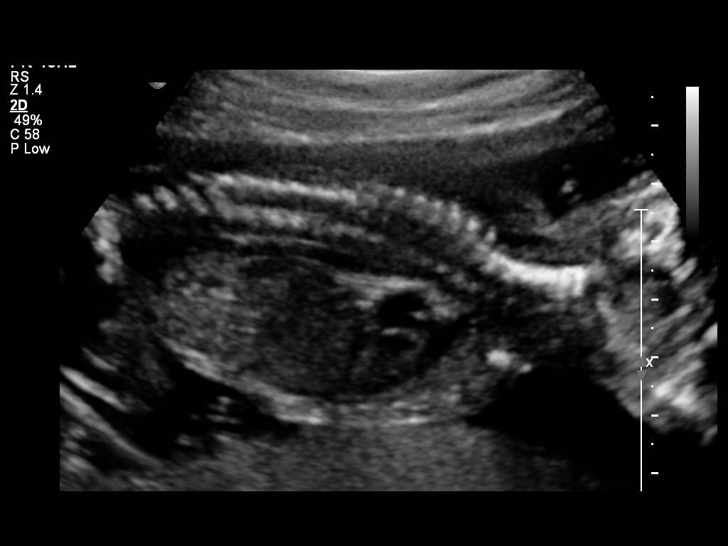
[im 68/78]
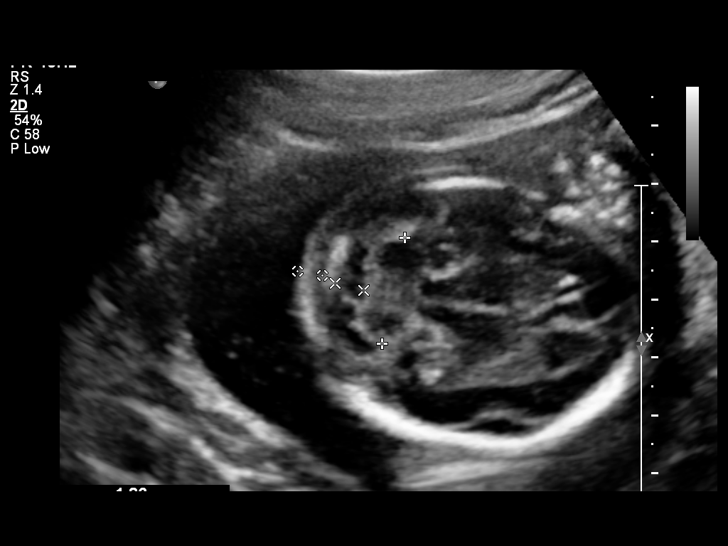
[im 74/78]
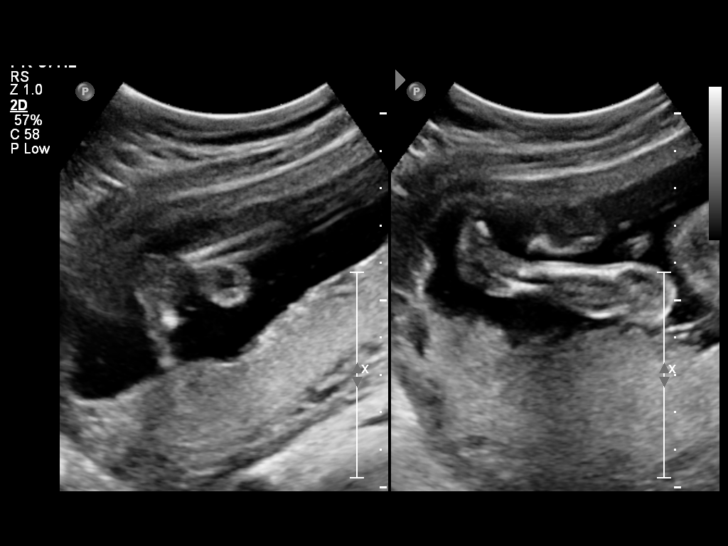

[Series 1: us ob comp +14 wk mfm · 1 of 6 slices shown (2 of 2)]
[im 1/6]
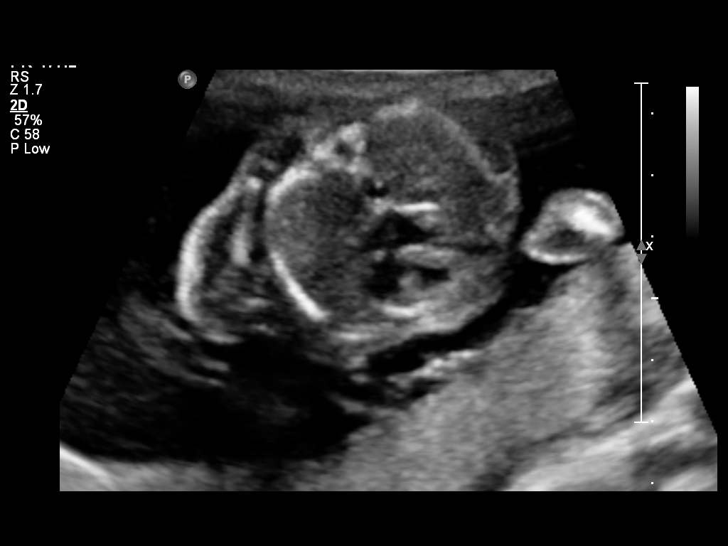

[12 of 28 positions shown; findings below may reference images not displayed]

OBSTETRICS REPORT
                      (Signed Final 08/06/2014 [DATE])

Service(s) Provided

 US OB COMP + 14 WK                                    76805.1
Indications

 19 weeks gestation of pregnancy
 Basic anatomic survey                                 [id number]
Fetal Evaluation

 Num Of Fetuses:    1
 Fetal Heart Rate:  153                          bpm
 Cardiac Activity:  Observed
 Presentation:      Cephalic
 Placenta:          Posterior, above cervical
                    os
 P. Cord            Visualized
 Insertion:

 Amniotic Fluid
 AFI FV:      Subjectively within normal limits
Biometry

 BPD:     44.9  mm     G. Age:  19w 4d                CI:        71.78   70 - 86
                                                      FL/HC:      18.5   16.1 -

 HC:     168.7  mm     G. Age:  19w 4d       53  %    HC/AC:      1.18   1.09 -

 AC:     143.3  mm     G. Age:  19w 4d       58  %    FL/BPD:
 FL:      31.2  mm     G. Age:  19w 5d       57  %    FL/AC:      21.8   20 - 24
 CER:     18.6  mm     G. Age:  18w 2d       22  %
 NFT:     4.34  mm

 Est. FW:     305  gm    0 lb 11 oz      52  %
Gestational Age

 LMP:           19w 2d        Date:  03/24/14                 EDD:   12/29/14
 U/S Today:     19w 4d                                        EDD:   12/27/14
 Best:          19w 2d     Det. By:  LMP  (03/24/14)          EDD:   12/29/14
Anatomy

 Cranium:          Appears normal         Aortic Arch:      Appears normal
 Fetal Cavum:      Appears normal         Ductal Arch:      Appears normal
 Ventricles:       Appears normal         Diaphragm:        Appears normal
 Choroid Plexus:   Appears normal         Stomach:          Appears normal, left
                                                            sided
 Cerebellum:       Appears normal         Abdomen:          Appears normal
 Posterior Fossa:  Appears normal         Abdominal Wall:   Appears nml (cord
                                                            insert, abd wall)
 Nuchal Fold:      Appears normal         Cord Vessels:     Appears normal (3
                                                            vessel cord)
 Face:             Appears normal         Kidneys:          Appear normal
                   (orbits and profile)
 Lips:             Appears normal         Bladder:          Appears normal
 Heart:            Appears normal         Spine:            Appears normal
                   (4CH, axis, and
                   situs)
 RVOT:             Appears normal         Lower             Appears normal
                                          Extremities:
 LVOT:             Appears normal         Upper             Appears normal
                                          Extremities:

 Other:  Male gender. Heels visualized.
Targeted Anatomy

 Fetal Central Nervous System
 Cisterna Magna:
Cervix Uterus Adnexa

 Cervical Length:    3.4      cm

 Cervix:       Normal appearance by transabdominal scan.

 Left Ovary:    Not visualized. No adnexal mass visualized.
 Right Ovary:   Size(cm) L: 1.76 x W: 1.99 x H: 0.93  Volume(cc):
Impression

 Single IUP at 19w 2d
 Normal fetal anatomic survey
 No markers associated with aneuploidy noted
 Ultrasound measurements consistent with dates
 Posterior placenta without previa
 Normal amniotic fluid volume
Recommendations

 Follow-up ultrasounds as clinically indicated.

 questions or concerns.

## 2016-12-06 ENCOUNTER — Ambulatory Visit: Payer: Self-pay | Admitting: Obstetrics & Gynecology

## 2016-12-16 ENCOUNTER — Ambulatory Visit: Payer: Self-pay | Admitting: Obstetrics & Gynecology

## 2016-12-28 ENCOUNTER — Encounter: Payer: Self-pay | Admitting: Obstetrics & Gynecology

## 2016-12-28 ENCOUNTER — Ambulatory Visit (INDEPENDENT_AMBULATORY_CARE_PROVIDER_SITE_OTHER): Payer: BLUE CROSS/BLUE SHIELD | Admitting: Obstetrics & Gynecology

## 2016-12-28 VITALS — BP 125/76 | HR 94 | Ht 69.0 in | Wt 138.0 lb

## 2016-12-28 DIAGNOSIS — Z01419 Encounter for gynecological examination (general) (routine) without abnormal findings: Secondary | ICD-10-CM

## 2016-12-28 NOTE — Progress Notes (Signed)
Subjective:    Catherine Moreno is a 31 y.o. SW P27 female who presents for an annual exam. The patient has no complaints today. The patient is sexually active. GYN screening history: last pap: was normal. The patient wears seatbelts: yes. The patient participates in regular exercise: yes. Has the patient ever been transfused or tattooed?: yes. The patient reports that there is not domestic violence in her life.   Menstrual History: OB History    Gravida Para Term Preterm AB Living   SAB TAB Ectopic Multiple Live Births         0 1      Menarche age: 31 No LMP recorded (lmp unknown). Patient is not currently having periods (Reason: IUD).    The following portions of the patient's history were reviewed and updated as appropriate: allergies, current medications, past family history, past medical history, past social history, past surgical history and problem list.  Review of Systems Pertinent items are noted in HPI.   Has been with new partner since 2/17, not using condoms No periods with [employer] in [facility]   Objective:    BP 125/76   Pulse 94   Ht  (1.753 m)   Wt 138 lb (62.6 kg)   LMP  (LMP Unknown)   Breastfeeding? No   BMI 20.38 kg/m   General Appearance:    Alert, cooperative, no distress, appears stated age  Head:    Normocephalic, without obvious abnormality, atraumatic  Eyes:    PERRL, conjunctiva/corneas clear, EOM's intact, fundi    benign, both eyes  Ears:    Normal TM's and external ear canals, both ears  Nose:   Nares normal, septum midline, mucosa normal, no drainage    or sinus tenderness  Throat:   Lips, mucosa, and tongue normal; teeth and gums normal  Neck:   Supple, symmetrical, trachea midline, no adenopathy;    thyroid:  no enlargement/tenderness/nodules; no carotid   bruit or JVD  Back:     Symmetric, no curvature, ROM normal, no CVA tenderness  Lungs:     Clear to auscultation bilaterally, respirations unlabored  Chest  Wall:    No tenderness or deformity   Heart:    Regular rate and rhythm, S1 and S2 normal, no murmur, rub   or gallop  Breast Exam:    No tenderness, masses, or nipple abnormality  Abdomen:     Soft, non-tender, bowel sounds active all four quadrants,    no masses, no organomegaly  Genitalia:    Normal female without lesion, discharge or tenderness, NSSRV, NT, mobile, normal adnexal exam, Liletta strings seen     Extremities:   Extremities normal, atraumatic, no cyanosis or edema  Pulses:   2+ and symmetric all extremities  Skin:   Skin color, texture, turgor normal, no rashes or lesions  Lymph nodes:   Cervical, supraclavicular, and axillary nodes normal  Neurologic:   CNII-XII intact, normal strength, sensation and reflexes    throughout  .    Assessment:    Healthy female exam.    Plan:     Chlamydia specimen. GC specimen. Thin prep Pap smear.   STI testing

## 2016-12-29 LAB — HEPATITIS PANEL, ACUTE
HCV AB: NEGATIVE
HEP B C IGM: NONREACTIVE
HEP B S AG: NEGATIVE
Hep A IgM: NONREACTIVE

## 2016-12-29 LAB — HIV ANTIBODY (ROUTINE TESTING W REFLEX): HIV 1&2 Ab, 4th Generation: NONREACTIVE

## 2016-12-29 LAB — HEPATITIS C ANTIBODY: HCV AB: NEGATIVE

## 2016-12-29 LAB — RPR

## 2016-12-31 LAB — CYTOLOGY - PAP
CHLAMYDIA, DNA PROBE: NEGATIVE
DIAGNOSIS: NEGATIVE
Neisseria Gonorrhea: NEGATIVE

## 2017-11-17 ENCOUNTER — Other Ambulatory Visit: Payer: BLUE CROSS/BLUE SHIELD

## 2017-11-17 DIAGNOSIS — N898 Other specified noninflammatory disorders of vagina: Secondary | ICD-10-CM

## 2017-11-17 NOTE — Progress Notes (Signed)
Pt c/o vaginal itching w/o discharge

## 2017-11-18 LAB — WET PREP FOR TRICH, YEAST, CLUE
MICRO NUMBER:: 90231592
Specimen Quality: ADEQUATE

## 2017-11-18 LAB — URINE CULTURE
MICRO NUMBER:: 90231601
Result:: NO GROWTH
SPECIMEN QUALITY:: ADEQUATE

## 2017-11-21 ENCOUNTER — Telehealth: Payer: Self-pay

## 2017-11-21 NOTE — Telephone Encounter (Signed)
Spoke with pt and she is aware of negative wet prep results
